# Patient Record
Sex: Male | Born: 1970
Health system: Southern US, Community
[De-identification: ages and names within clinical notes are randomized; demographics above are authoritative.]

## PROBLEM LIST (undated history)

## (undated) DIAGNOSIS — E785 Hyperlipidemia, unspecified: Secondary | ICD-10-CM

## (undated) DIAGNOSIS — T7840XA Allergy, unspecified, initial encounter: Secondary | ICD-10-CM

## (undated) DIAGNOSIS — I2510 Atherosclerotic heart disease of native coronary artery without angina pectoris: Secondary | ICD-10-CM

## (undated) DIAGNOSIS — K635 Polyp of colon: Secondary | ICD-10-CM

## (undated) DIAGNOSIS — E039 Hypothyroidism, unspecified: Secondary | ICD-10-CM

## (undated) DIAGNOSIS — F419 Anxiety disorder, unspecified: Secondary | ICD-10-CM

## (undated) DIAGNOSIS — K219 Gastro-esophageal reflux disease without esophagitis: Secondary | ICD-10-CM

## (undated) DIAGNOSIS — Z800 Family history of malignant neoplasm of digestive organs: Secondary | ICD-10-CM

## (undated) DIAGNOSIS — F329 Major depressive disorder, single episode, unspecified: Secondary | ICD-10-CM

## (undated) DIAGNOSIS — C4491 Basal cell carcinoma of skin, unspecified: Secondary | ICD-10-CM

## (undated) DIAGNOSIS — F32A Depression, unspecified: Secondary | ICD-10-CM

## (undated) DIAGNOSIS — C8190 Hodgkin lymphoma, unspecified, unspecified site: Secondary | ICD-10-CM

## (undated) DIAGNOSIS — E663 Overweight: Secondary | ICD-10-CM

## (undated) DIAGNOSIS — L570 Actinic keratosis: Secondary | ICD-10-CM

## (undated) HISTORY — DX: Polyp of colon: K63.5

## (undated) HISTORY — DX: Actinic keratosis: L57.0

## (undated) HISTORY — DX: Allergy, unspecified, initial encounter: T78.40XA

## (undated) HISTORY — DX: Hyperlipidemia, unspecified: E78.5

## (undated) HISTORY — DX: Family history of malignant neoplasm of digestive organs: Z80.0

## (undated) HISTORY — DX: Atherosclerotic heart disease of native coronary artery without angina pectoris: I25.10

## (undated) HISTORY — DX: Gastro-esophageal reflux disease without esophagitis: K21.9

## (undated) HISTORY — DX: Depression, unspecified: F32.A

## (undated) HISTORY — DX: Overweight: E66.3

## (undated) HISTORY — DX: Anxiety disorder, unspecified: F41.9

## (undated) HISTORY — DX: Hypothyroidism, unspecified: E03.9

---
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
## 2004-10-21 ENCOUNTER — Ambulatory Visit: Payer: Self-pay | Admitting: Internal Medicine

## 2004-10-28 ENCOUNTER — Ambulatory Visit: Payer: Self-pay | Admitting: Surgery

## 2004-12-17 ENCOUNTER — Ambulatory Visit: Payer: Self-pay | Admitting: Gastroenterology

## 2006-11-15 IMAGING — CT CT ABD-PELV W/ CM
1 of 2 series · 15 of 32 positions shown, 19 images · non-contrast
Comparison: none

REASON FOR EXAM: Right upper quadrant pain. Evaluate appendicitis; call
COMMENTS:

[Series 2: appendicitis · axial · 0.70mm/px · z∈[-878,-450]mm · 15 of 160 slices shown, 19 images]
[im 11/160  soft-tissue]
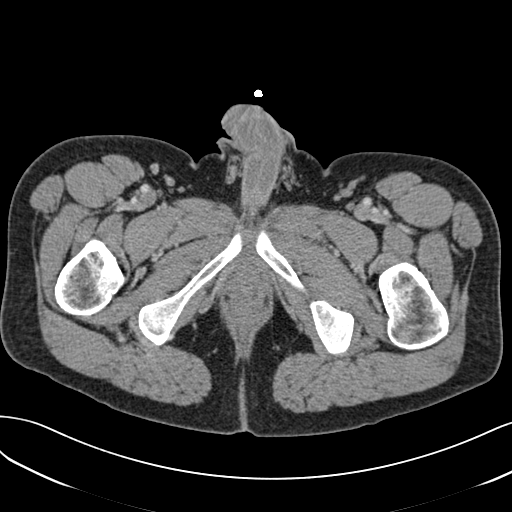
[im 11/160  bone]
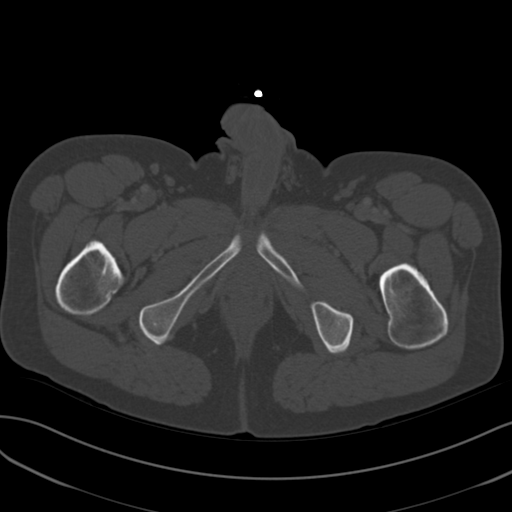
[im 22/160  soft-tissue]
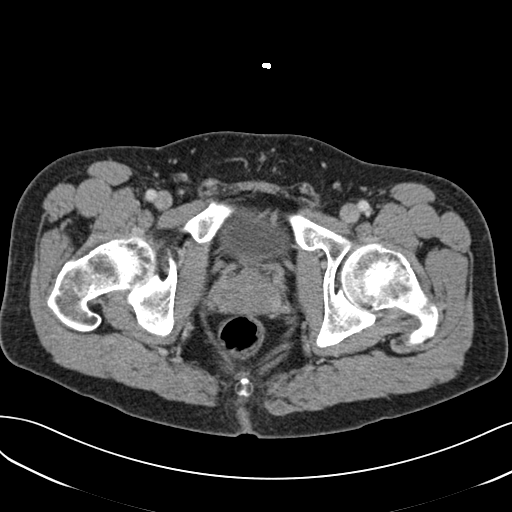
[im 33/160  soft-tissue]
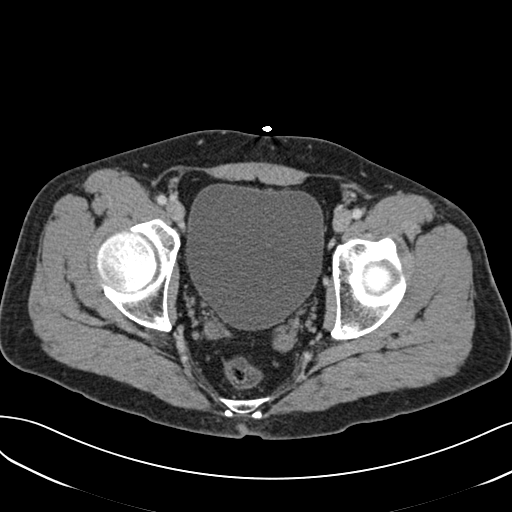
[im 44/160  soft-tissue]
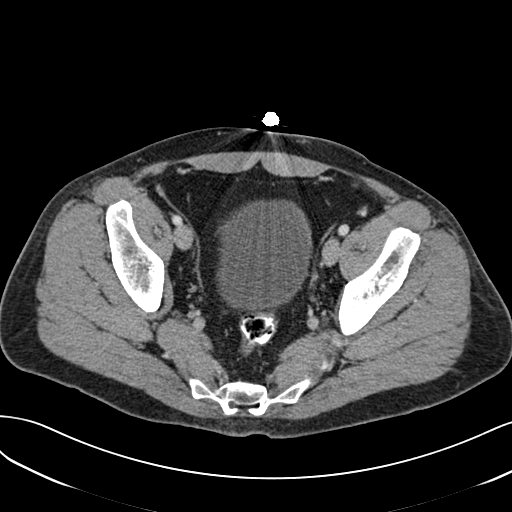
[im 55/160  soft-tissue]
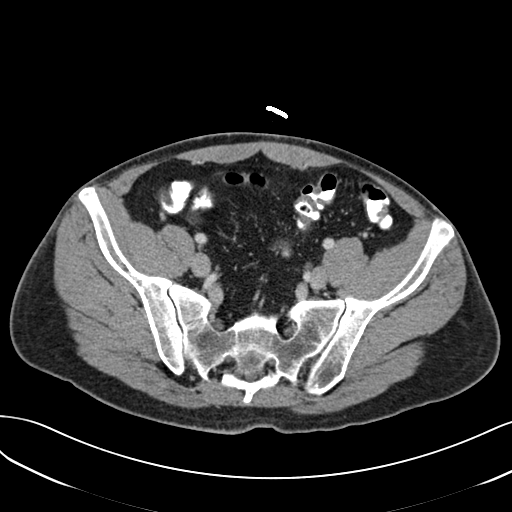
[im 66/160  soft-tissue]
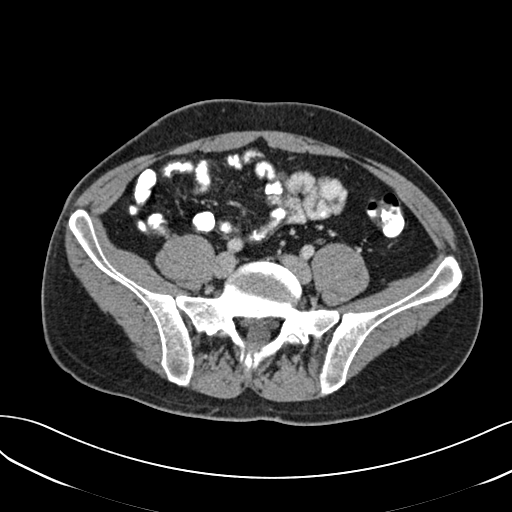
[im 83/160  soft-tissue]
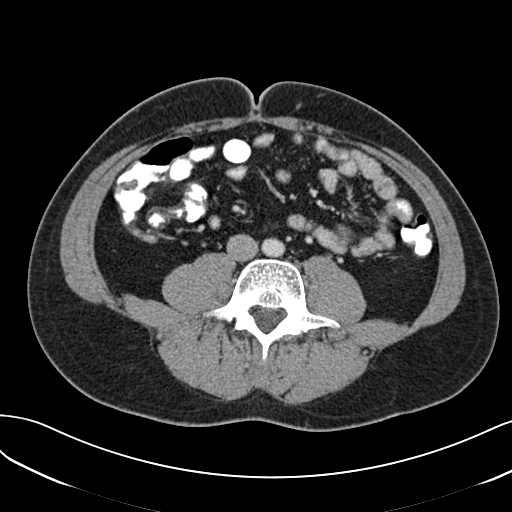
[im 94/160  soft-tissue]
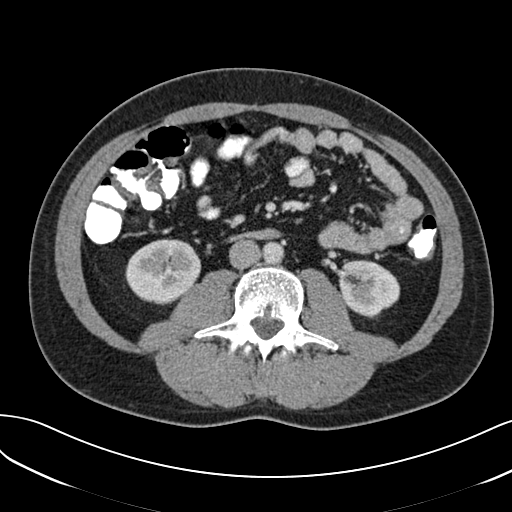
[im 105/160  soft-tissue]
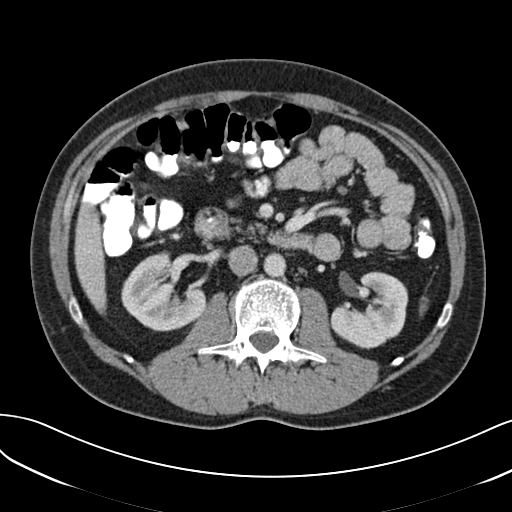
[im 105/160  bone]
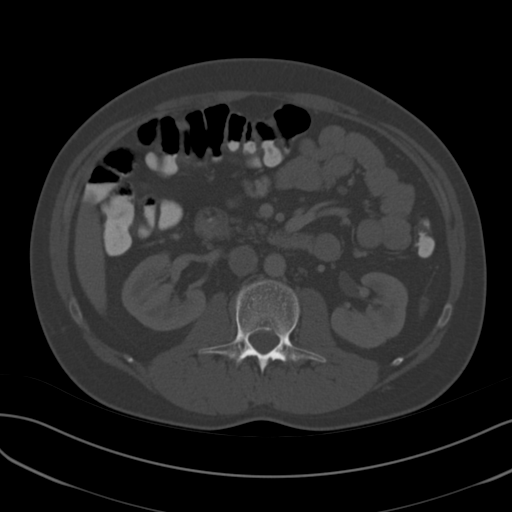
[im 116/160  soft-tissue]
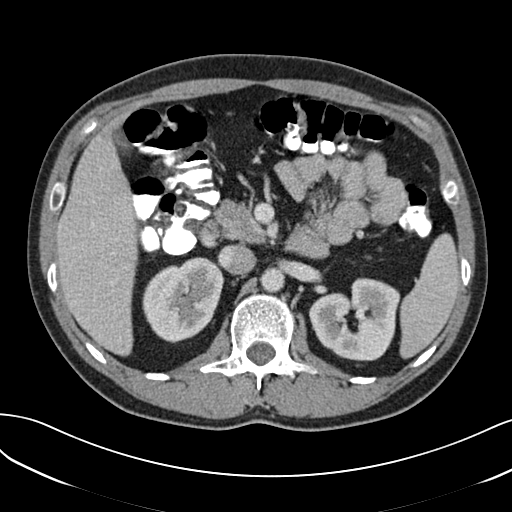
[im 127/160  soft-tissue]
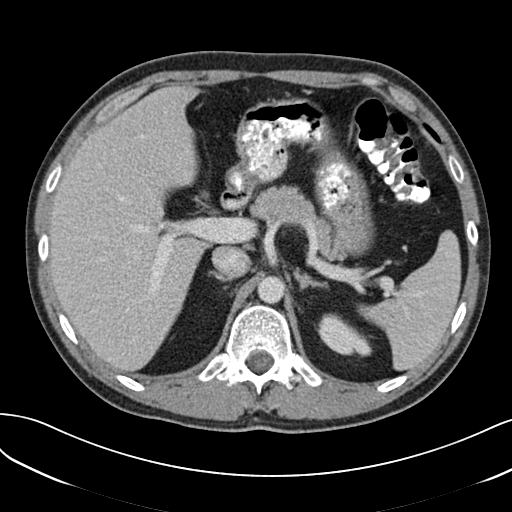
[im 138/160  soft-tissue]
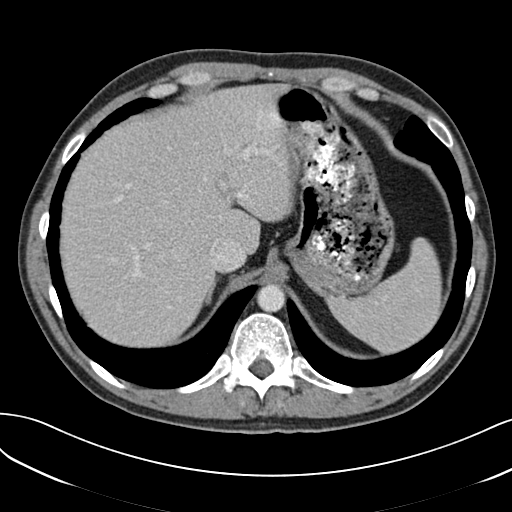
[im 138/160  lung]
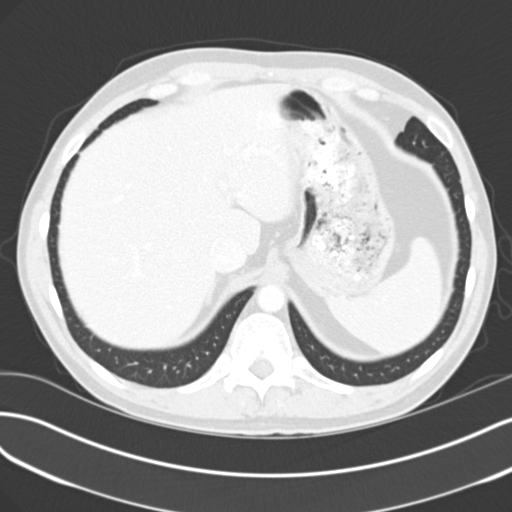
[im 143/160  lung]
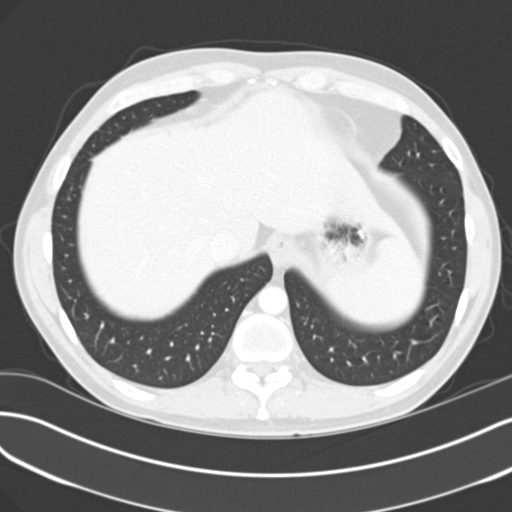
[im 149/160  soft-tissue]
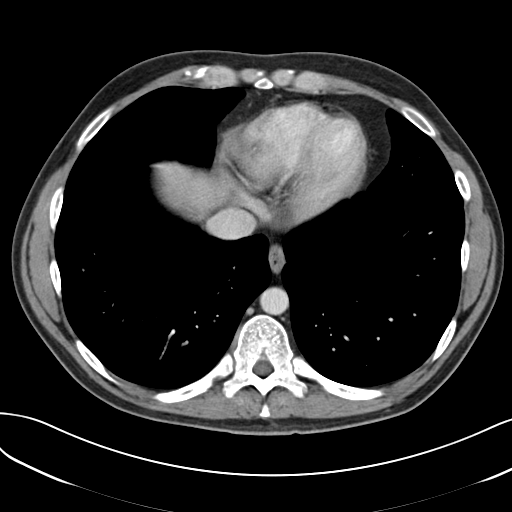
[im 149/160  lung]
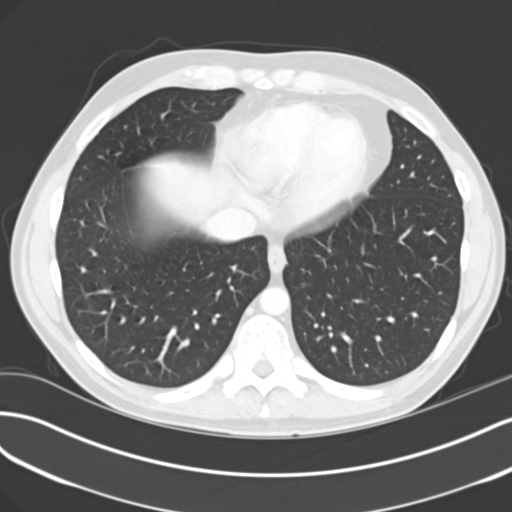
[im 154/160  lung]
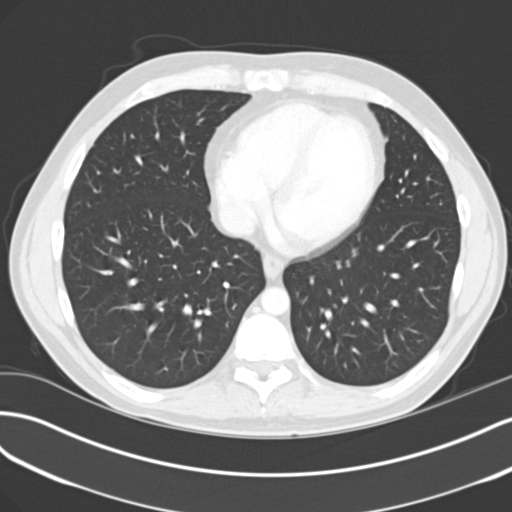

[15 of 32 positions shown; findings below may reference images not displayed]

PROCEDURE:     CT  - CT ABDOMEN / PELVIS  W  - October 21, 2004  [DATE]

RESULT:      IV and oral-enhanced CT scan of the abdomen and pelvis is
performed.  The patient has no prior stud for comparison.  The appendix is
seen and does not appear to be abnormally distended.  The appendix is
somewhat long and is seen in a retrocecal or retrocolonic location.  There
is some density within the appendix which could either be secondary to oral
contrast within a small portion of the appendix or a small appendicolith.
This is seen on the axial reconstructions on image #77.  On image 75, there
is some density lateral to the RIGHT colon with somewhat poorly defined
margins.  This may be volume averaging with the ascending colon.  No
significant inflammatory changes are evident.  There is no abscess.  There
is no free fluid or abnormal bowel distention. There is no evidence of
inguinal, pelvic, mesenteric or retroperitoneal adenopathy.  There is no
evidence of diverticulitis.   The aorta is normal in caliber.  The kidneys,
pancreas, adrenal glands, liver and spleen appear to be within normal
limits.  The lung bases are clear.
IMPRESSION: No CT evidence of appendicitis.  There may be a small area of inflammation
of an epiploic appendage which can mimic appendicitis. Continued close
clinical follow up is recommended given the possibility a small
appendicolith cannot be excluded.

## 2006-12-15 ENCOUNTER — Emergency Department: Payer: Self-pay | Admitting: Emergency Medicine

## 2009-06-04 DIAGNOSIS — C4491 Basal cell carcinoma of skin, unspecified: Secondary | ICD-10-CM

## 2009-06-04 DIAGNOSIS — D239 Other benign neoplasm of skin, unspecified: Secondary | ICD-10-CM

## 2009-06-04 HISTORY — DX: Basal cell carcinoma of skin, unspecified: C44.91

## 2009-06-04 HISTORY — DX: Other benign neoplasm of skin, unspecified: D23.9

## 2009-12-03 ENCOUNTER — Ambulatory Visit: Payer: Self-pay | Admitting: General Practice

## 2011-06-04 ENCOUNTER — Ambulatory Visit: Payer: Self-pay | Admitting: General Practice

## 2011-06-05 ENCOUNTER — Ambulatory Visit: Payer: Self-pay | Admitting: General Practice

## 2014-04-16 ENCOUNTER — Ambulatory Visit
Admit: 2014-04-16 | Disposition: A | Discharge status: Home / Self Care | Payer: Self-pay | Attending: General Practice | Admitting: General Practice

## 2014-06-05 HISTORY — PX: COLONOSCOPY: SHX174

## 2014-06-05 HISTORY — PX: ESOPHAGOGASTRODUODENOSCOPY: SHX1529

## 2014-06-05 LAB — HM COLONOSCOPY

## 2015-06-07 ENCOUNTER — Emergency Department
Admission: EM | Admit: 2015-06-07 | Discharge: 2015-06-07 | Disposition: A | Discharge status: Home / Self Care | Payer: No Typology Code available for payment source | Attending: Emergency Medicine | Admitting: Emergency Medicine

## 2015-06-07 ENCOUNTER — Encounter: Payer: Self-pay | Admitting: Emergency Medicine

## 2015-06-07 ENCOUNTER — Emergency Department: Payer: No Typology Code available for payment source

## 2015-06-07 DIAGNOSIS — R109 Unspecified abdominal pain: Secondary | ICD-10-CM | POA: Diagnosis not present

## 2015-06-07 DIAGNOSIS — Y9389 Activity, other specified: Secondary | ICD-10-CM | POA: Diagnosis not present

## 2015-06-07 DIAGNOSIS — Y999 Unspecified external cause status: Secondary | ICD-10-CM | POA: Diagnosis not present

## 2015-06-07 DIAGNOSIS — Y92410 Unspecified street and highway as the place of occurrence of the external cause: Secondary | ICD-10-CM | POA: Diagnosis not present

## 2015-06-07 DIAGNOSIS — Z8571 Personal history of Hodgkin lymphoma: Secondary | ICD-10-CM | POA: Diagnosis not present

## 2015-06-07 DIAGNOSIS — S0083XA Contusion of other part of head, initial encounter: Secondary | ICD-10-CM | POA: Diagnosis not present

## 2015-06-07 DIAGNOSIS — S0990XA Unspecified injury of head, initial encounter: Secondary | ICD-10-CM | POA: Diagnosis present

## 2015-06-07 HISTORY — DX: Hodgkin lymphoma, unspecified, unspecified site: C81.90

## 2015-06-07 MED ORDER — METHOCARBAMOL 750 MG PO TABS: 750.0000 mg | ORAL_TABLET | Freq: Four times a day (QID) | ORAL | Status: DC

## 2015-06-07 MED ORDER — TRAMADOL HCL 50 MG PO TABS: 50.0000 mg | ORAL_TABLET | Freq: Four times a day (QID) | ORAL | Status: DC | PRN

## 2015-06-07 MED ORDER — IBUPROFEN 600 MG PO TABS: 600.0000 mg | ORAL_TABLET | Freq: Three times a day (TID) | ORAL | Status: DC | PRN

## 2015-06-07 NOTE — ED Notes (Signed)
Reviewed d/c instructions, prescriptions, and follow-up care with pt. Pt verbalized understanding
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
## 2015-06-07 NOTE — Discharge Instructions (Signed)
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
## 2015-08-07 DIAGNOSIS — R0602 Shortness of breath: Secondary | ICD-10-CM | POA: Insufficient documentation

## 2015-08-07 DIAGNOSIS — I10 Essential (primary) hypertension: Secondary | ICD-10-CM | POA: Insufficient documentation

## 2015-08-07 DIAGNOSIS — E782 Mixed hyperlipidemia: Secondary | ICD-10-CM | POA: Insufficient documentation

## 2015-11-21 ENCOUNTER — Ambulatory Visit
Admission: RE | Admit: 2015-11-21 | Discharge: 2015-11-21 | Disposition: A | Discharge status: Home / Self Care | Payer: BLUE CROSS/BLUE SHIELD | Source: Ambulatory Visit | Attending: Family Medicine | Admitting: Family Medicine

## 2015-11-21 ENCOUNTER — Other Ambulatory Visit: Payer: Self-pay | Admitting: Family Medicine

## 2015-11-21 DIAGNOSIS — R0600 Dyspnea, unspecified: Secondary | ICD-10-CM

## 2016-04-08 LAB — PSA: PSA: 1.4

## 2016-04-08 LAB — LIPID PANEL
Cholesterol: 170 (ref 0–200)
Cholesterol: 170 (ref 0–200)
HDL: 34 — AB (ref 35–70)
HDL: 34 — AB (ref 35–70)
LDL Cholesterol: 108
LDL Cholesterol: 108
Triglycerides: 141 (ref 40–160)
Triglycerides: 141 (ref 40–160)

## 2016-07-13 DIAGNOSIS — Z9189 Other specified personal risk factors, not elsewhere classified: Secondary | ICD-10-CM | POA: Insufficient documentation

## 2016-07-14 DIAGNOSIS — Z8571 Personal history of Hodgkin lymphoma: Secondary | ICD-10-CM | POA: Insufficient documentation

## 2016-08-05 DIAGNOSIS — I2510 Atherosclerotic heart disease of native coronary artery without angina pectoris: Secondary | ICD-10-CM | POA: Insufficient documentation

## 2016-12-02 DIAGNOSIS — G4719 Other hypersomnia: Secondary | ICD-10-CM | POA: Insufficient documentation

## 2017-04-12 LAB — LIPID PANEL
Cholesterol: 116 (ref 0–200)
HDL: 33 — AB (ref 35–70)
LDL Cholesterol: 61
Triglycerides: 111 (ref 40–160)

## 2017-04-12 LAB — PSA: PSA: 1.2

## 2017-10-20 LAB — TSH: TSH: 2.49 (ref 0.41–5.90)

## 2017-12-15 IMAGING — CR DG CHEST 2V
1 series · 2 of 2 positions shown · non-contrast
Comparison: None.

CLINICAL DATA: Dyspnea.

EXAM:
CHEST  2 VIEW

[Series 1: dg chest 2 view · 0.14mm/px · 2 of 2 slices shown]
[im 1/2]
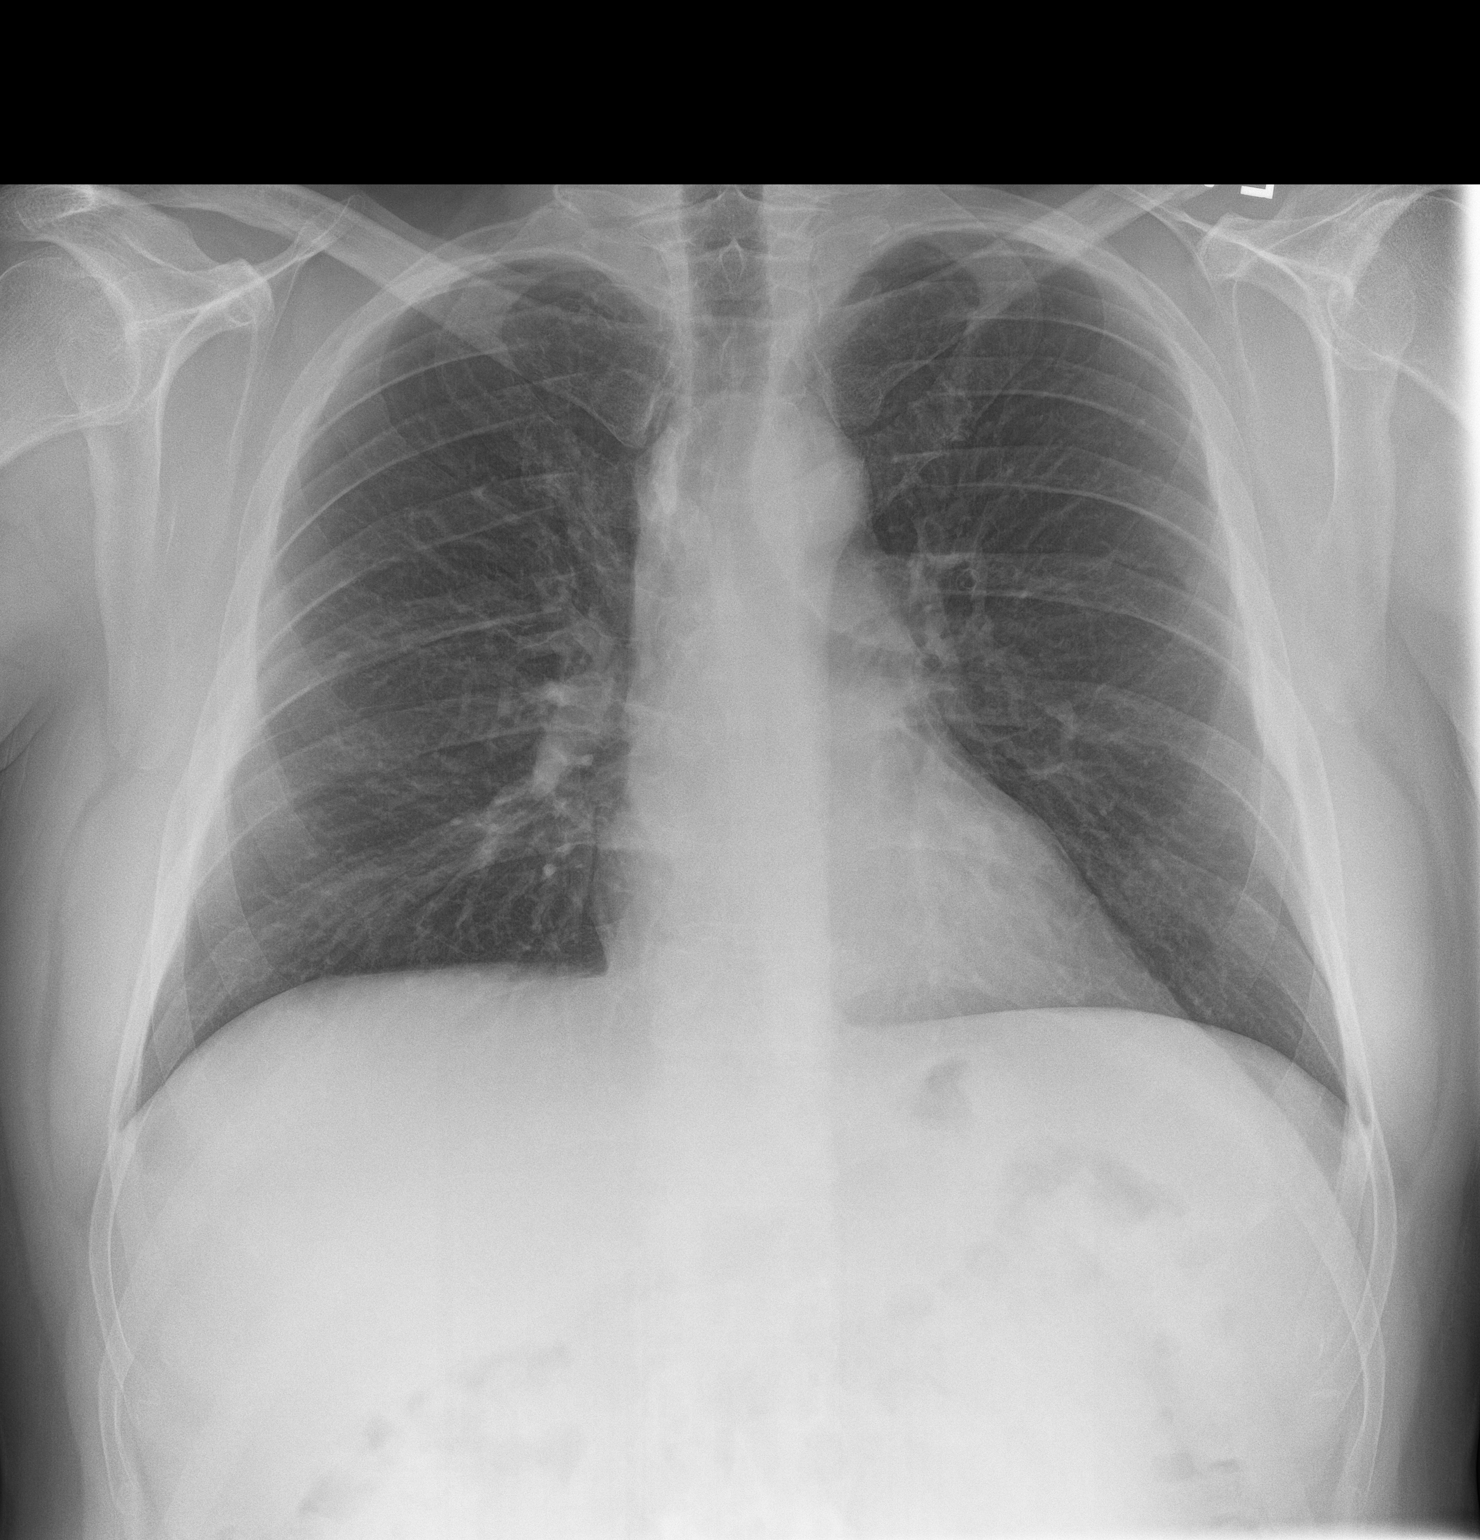
[im 2/2]
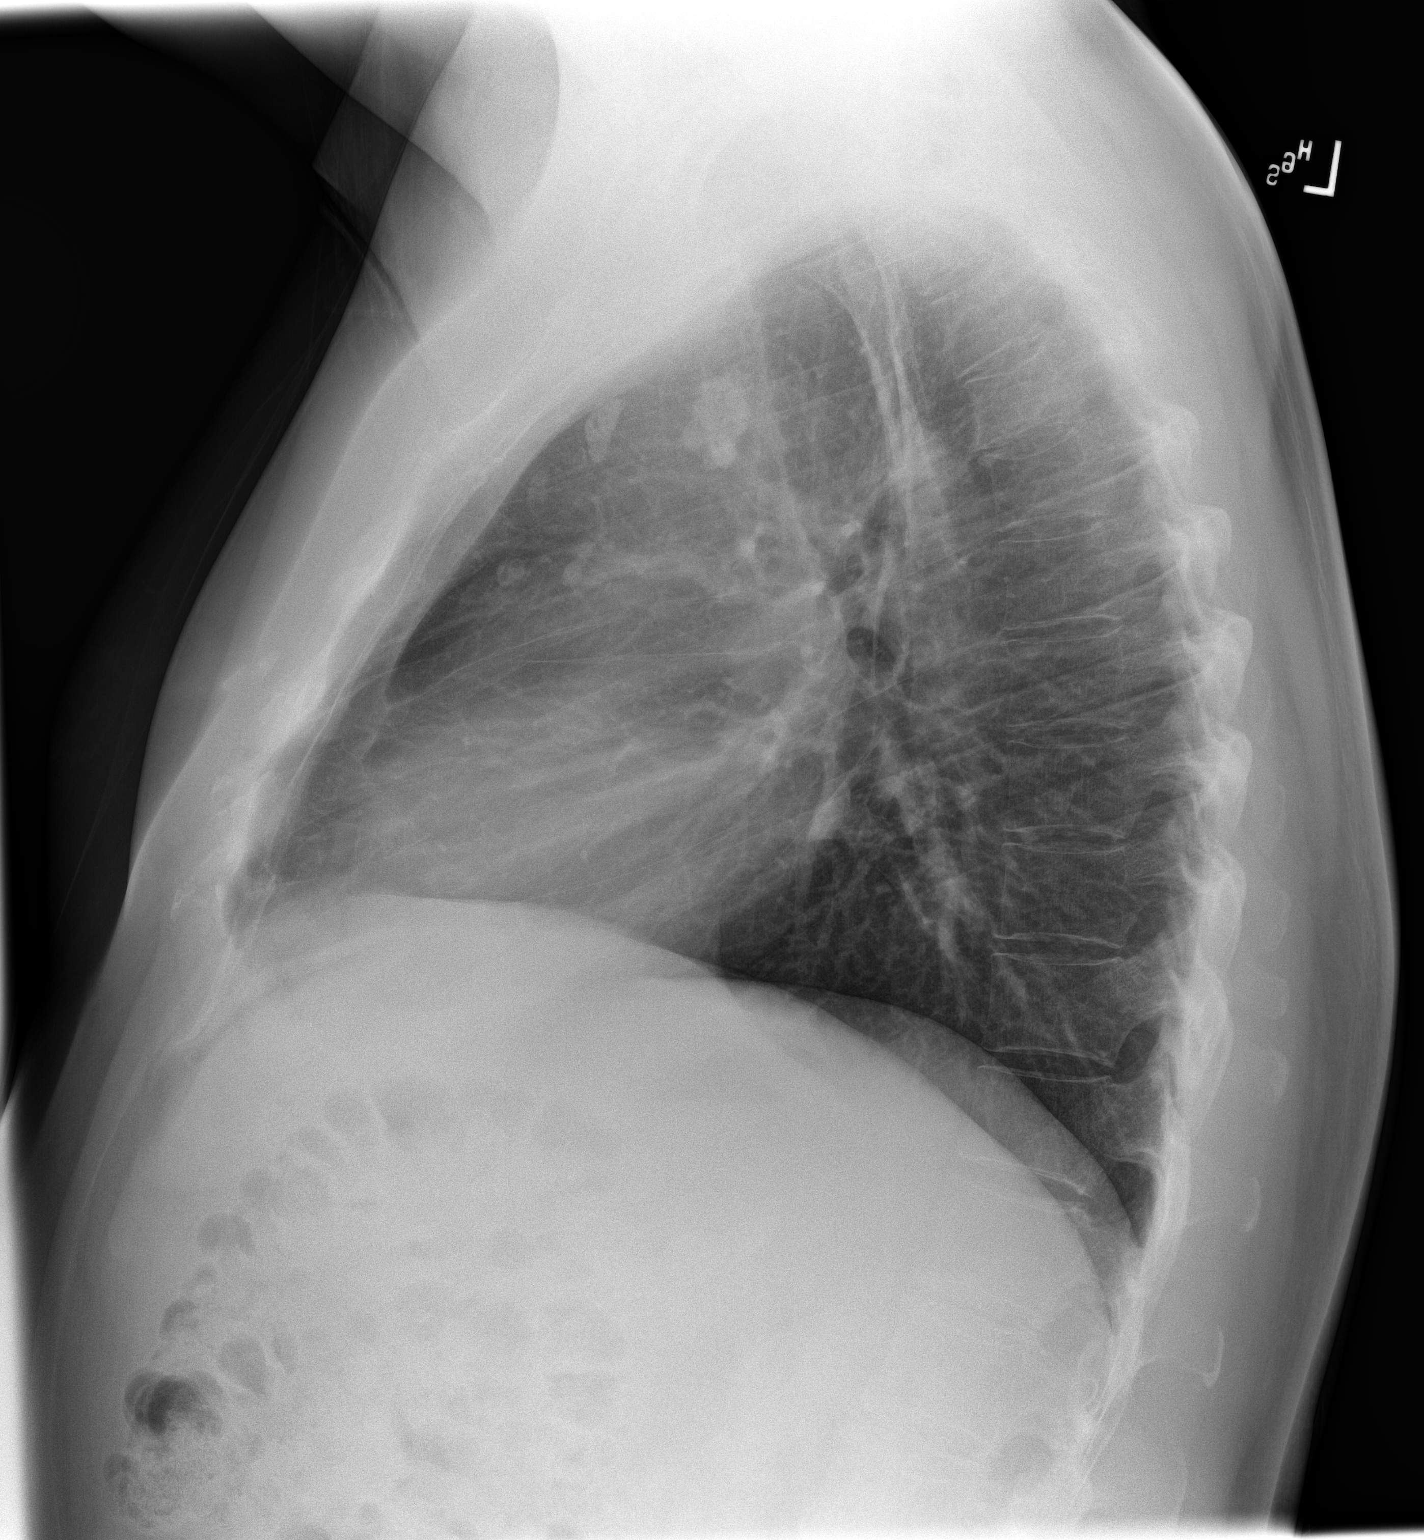

[2 of 2 positions shown; findings below may reference images not displayed]

FINDINGS: The heart size and mediastinal contours are within normal limits. No
pneumothorax or pleural effusion is noted. Probable old calcified
lymph node is noted in right peritracheal region. Both lungs are
clear. The visualized skeletal structures are unremarkable.
IMPRESSION: No active cardiopulmonary disease.

## 2018-01-09 DIAGNOSIS — E039 Hypothyroidism, unspecified: Secondary | ICD-10-CM | POA: Insufficient documentation

## 2018-02-28 ENCOUNTER — Encounter: Payer: BLUE CROSS/BLUE SHIELD | Attending: Internal Medicine | Admitting: Dietician

## 2018-02-28 VITALS — Ht 72.0 in | Wt 238.0 lb

## 2018-02-28 DIAGNOSIS — Z6831 Body mass index (BMI) 31.0-31.9, adult: Secondary | ICD-10-CM

## 2018-02-28 DIAGNOSIS — E785 Hyperlipidemia, unspecified: Secondary | ICD-10-CM

## 2018-02-28 DIAGNOSIS — E6609 Other obesity due to excess calories: Secondary | ICD-10-CM | POA: Diagnosis not present

## 2018-02-28 NOTE — Patient Instructions (Addendum)
Recommendations for Heart Health: Eat more meals at home. Strive for 5 meals prepared at home. Keep frozen vegetables, fruits as sides.  Take lunch to work 3 days per week. Add fruit, raw vegetables as sides. Read labels for sodium- Goal is less than 2000 mg per day Try Ms. DASH seasonings: lemon pepper, garlic pepper, fiesta lime as examples Starkist-no salt added tuna, no salt added tomato sauce, tomatoes to make a sauce (oregano, basil,) Saturated fat goal: less than 14 gms per day. (Refer to handout) May want to try an app like MyFitness Pal to record food/beverage intake.

## 2018-02-28 NOTE — Progress Notes (Signed)
Medical Nutrition Therapy: Visit start time: 11:30 am end time: 1240}  Assessment:  Diagnosis: obesity, hyperlipidemia  Past medical history: hypertension, depression Psychosocial issues/ stress concerns:  Patient scored mild depression on PHQ scale; takes medication and meets with a counselor  Preferred learning method:  . Hands-on  Current weight: 238 lbs (with shoes) Height: 72 in  Medications, supplements: see list  Progress and evaluation:  Patient in for initial medical nutrition therapy appointment. He reports he is making an effort to eat more meals at home. Presently has decreased from 15 meals per week eaten "out" to 12 meals "out" or take out"  His goal weight is 200 lbs which he weighed 5-6 years ago. He is making an effort to choose healthier foods when "out". Previously his lunch choices have been hot dogs,burgers, [place] or [place]. Last week he took some luncheon meat to work and made a sandwich. His dinner meals are usually something he can pick up on the way home from work such as [employer] or [employer] a. For the past few months,he states that since his wife doesn't cook, he has been making an effort to prepare some simple dinner meals such as pork chops with steamed vegetables or meatloaf made with ground beef and a seasoning packet, mashed potatoes and green beans as  examples. He typically drinks sweet tea or soda with meals "out". He also drinks water with sugar free flavor packets added. His present diet is high in sodium. Portions, calories and fat are also more difficult to control with frequent dining out or take out meals. He has an inadequate intake of fruits, vegetables, whole grains and fiber. He has an allergy to the protein in milk and uses almond milk in cereal, etc. When asked about his readiness to make diet changes, he states "I have too". He states he wants a meal plan for heart health.   Physical activity: inconsistent with any type of structured  exercise  Dietary Intake:   Nutrition Care Education:  Weight control:  Discussed challenge of changing eating habits especially of preparing more meals at home and commended on his effort to do so. Instructed on a meal plan based on 1900-2000 calories including how to better balance carbohydrate foods with protein and non-starchy vegetables. Gave and reviewed simple meal ideas.  Also, discussed healthier options when eating "out" or picking up "take out". Heart Health: Instructed on dietary guidelines for heart health including guidelines for sodium and saturated fat and stressed importance of increasing fruits/vegetables in the diet. Discussed alternative seasonings to salt and specific recipes. Discussed importance of structured exercise for both weight control and heart health. Instructed on grapefruit/statin interaction.  Nutritional Diagnosis:  NI-5.10.2 Excessive mineral intake (specify): sodium As related to frequent dining out with high sodium choices.  As evidenced by diet history. NI-5.11.1 Predicted suboptimal nutrient intake As related to low intake of fruits, vegetables, whole grains, fiber.  As evidenced by diet history..  Intervention:  Recommendations for Heart Health: Eat more meals at home. Strive for 5 meals prepared at home. Keep frozen vegetables, fruits as sides.  Take lunch to work 3 days per week. Add fruit, raw vegetables as sides. Read labels for sodium- Goal is less than 2000 mg per day Try Ms. DASH seasonings: lemon pepper, garlic pepper, fiesta lime as examples Starkist-no salt added tuna, no salt added tomato sauce, tomatoes to make a sauce (oregano, basil,) Saturated fat goal: less than 14 gms per day. (Refer to handout) May  want to try an app like MyFitness Pal to record food/beverage intake.   Education Materials given:  . Cholesterol-lowering/ Heart health . Food lists/ Planning A Balanced Meal . Recipes . Quick and Healthy Meals . Fats: The  Good, the Bad and the Ugly . List of high sodium foods and lower sodium alternatives. . Goals/ instructions  Learner/ who was taught:  . Patient   Level of understanding: . Partial understanding; needs review/ practice  Demonstrated degree of understanding via:   Teach back Learning barriers: . None  Willingness to learn/ readiness for change: . Acceptance, ready for change  Monitoring and Evaluation:  Dietary intake, exercise,  and body weight      follow up: March 24 at 10:30am

## 2018-03-28 ENCOUNTER — Ambulatory Visit: Payer: BLUE CROSS/BLUE SHIELD | Admitting: Dietician

## 2018-04-04 ENCOUNTER — Other Ambulatory Visit: Payer: Self-pay

## 2018-04-04 ENCOUNTER — Encounter: Payer: Self-pay | Admitting: Dietician

## 2018-04-04 ENCOUNTER — Encounter: Payer: BLUE CROSS/BLUE SHIELD | Attending: Internal Medicine | Admitting: Dietician

## 2018-04-04 VITALS — Ht 72.0 in | Wt 235.6 lb

## 2018-04-04 DIAGNOSIS — Z6831 Body mass index (BMI) 31.0-31.9, adult: Secondary | ICD-10-CM

## 2018-04-04 DIAGNOSIS — E6609 Other obesity due to excess calories: Secondary | ICD-10-CM

## 2018-04-04 DIAGNOSIS — E785 Hyperlipidemia, unspecified: Secondary | ICD-10-CM

## 2018-04-04 NOTE — Progress Notes (Signed)
Medical Nutrition Therapy Follow-up visit:  Time with patient: 9:00am-9:45am Visit # :2 ASSESSMENT:  Diagnosis: obesity, hyperlipidemia  Current weight:235.6 lbs    Height:72 in BMI: 31.9 Medications: See list Medical History: hypertension, depression Progress and evaluation: Patient in for medical nutrition therapy follow-up. He reports family is eating more meals prepared at home, partly because of change in work schedule due to Covid-19. He goes into work 2-3 days per week and works from home other days. He is eating 3 meals/day. When at home, most meals are prepared at home. On work days, he gets off work at [employer] and picks up a meal. He reports he is eating less at meals. He eats something sweet in the evening but buys portioned single serving cakes, etc as a strategy. He has decreased soda to 2-3 per week. He drinks 4 bottles of water daily with sugar free flavor pkt added. Weight today is 2.4 lbs less than weight 5 weeks ago.  Physical activity:Is walking 4 days/week; 20 minutes  NUTRITION CARE EDUCATION:    Weight control: Reviewed progress with previous goals set. Commended on progress made toward goals. Discussed 2-4 lb loss per month as appropriate rate of loss.  Discussed how increasing intensity of exercise is option if not able to increase frequency.  Reviewed recommendations for fruit/vegetable intake and discussed further easy ways to include. Reviewed balance of foods at meals based on food guide plate. Label Reading: Encouraged again to read labels for sodium and saturated fat as related to heart health and reviewed dietary guidelines for both.  INTERVENTION:  Continue to balance meals with protein, 2-4 servings of carbohydrate (starch, fruit, small serving of sweets) and non-starchy vegetables. Continue to try to increase fruit and vegetable intake. Look at labels for sodium- Goal- no more than 2000 mg/day.  Exercise - 4 days per week for 20-30  minutes  EDUCATION MATERIALS GIVEN:  . Goals/ instructions  LEARNER/ who was taught:  . Patient   LEVEL OF UNDERSTANDING: . Verbalizes/ demonstrates competency Demonstrated degree of understanding via teach back.  LEARNING BARRIERS: . None  WILLINGNESS TO LEARN/READINESS FOR CHANGE: . Change in progress  MONITORING AND EVALUATION:  No follow-up scheduled. Patient was encouraged to call if desires further help with his diet/nutrition.

## 2018-04-04 NOTE — Patient Instructions (Addendum)
Continue to balance meals with protein, 2-4 servings of carbohydrate (starch, fruit, small serving of sweets) and non-starchy vegetables. Continue to try to increase fruit and vegetable intake. Look at labels for sodium- Goal- no more than 2000 mg/day. Exercise - 4 days per week for 20-30 minutes

## 2018-07-10 LAB — TSH
Free Thyroxine Index: 2.2
Free Thyroxine Index: 2.7
T3 Uptake: 24
T3 Uptake: 25
T4, Total: 10.9
T4, Total: 9.2
TSH: 1.72
TSH: 2.13
TSH: 2.68
TSH: 5.39

## 2018-07-10 LAB — HEPATITIS B SURFACE ANTIBODY,QUALITATIVE: Hep B S Ab: REACTIVE

## 2018-07-11 ENCOUNTER — Other Ambulatory Visit: Payer: Self-pay

## 2018-07-11 ENCOUNTER — Encounter: Payer: Self-pay | Admitting: Internal Medicine

## 2018-07-11 ENCOUNTER — Ambulatory Visit: Payer: 59 | Admitting: Internal Medicine

## 2018-07-11 VITALS — BP 116/73 | HR 78 | Temp 96.7°F | Resp 14 | Ht 72.0 in | Wt 234.0 lb

## 2018-07-11 DIAGNOSIS — B359 Dermatophytosis, unspecified: Secondary | ICD-10-CM

## 2018-07-11 DIAGNOSIS — L309 Dermatitis, unspecified: Secondary | ICD-10-CM

## 2018-07-11 DIAGNOSIS — L308 Other specified dermatitis: Secondary | ICD-10-CM

## 2018-07-11 MED ORDER — CLOTRIMAZOLE-BETAMETHASONE 1-0.05 % EX CREA: 1.0000 "application " | TOPICAL_CREAM | Freq: Two times a day (BID) | CUTANEOUS | 1 refills | Status: DC
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
## 2018-09-07 ENCOUNTER — Other Ambulatory Visit: Payer: Self-pay

## 2018-09-07 ENCOUNTER — Encounter: Payer: Self-pay | Admitting: Internal Medicine

## 2018-09-07 ENCOUNTER — Ambulatory Visit: Payer: Self-pay | Admitting: Internal Medicine

## 2018-09-07 VITALS — BP 133/88 | HR 83 | Temp 97.9°F | Resp 16 | Ht 72.0 in | Wt 260.0 lb

## 2018-09-07 DIAGNOSIS — M7050 Other bursitis of knee, unspecified knee: Secondary | ICD-10-CM

## 2018-09-07 DIAGNOSIS — M25861 Other specified joint disorders, right knee: Secondary | ICD-10-CM

## 2018-09-07 NOTE — Progress Notes (Signed)
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 Cheese Anaphylaxis  . Erythromycin Other (See Comments)  . Lac Bovis Swelling     Current Outpatient Medications on File Prior to Visit  Medication Sig Dispense Refill  . aspirin EC 81 MG tablet Take by mouth.    Marland Kitchen atorvastatin (LIPITOR) 80 MG tablet Take by mouth.    . famotidine (PEPCID) 20 MG tablet Take 20 mg by mouth daily.    Marland Kitchen FLUoxetine (PROZAC) 20 MG capsule Take by mouth.    . levothyroxine (SYNTHROID) 100 MCG tablet     . metoprolol succinate (TOPROL-XL) 25 MG 24 hr tablet Take by mouth.     No current facility-administered medications on file prior to visit.     No tob hx  O - NAD, masked, obese  BP 133/88 (BP Location: Right Arm, Patient Position: Sitting, Cuff Size: Large)   Pulse 83   Temp 97.9 F (36.6 C) (Oral)   Resp 16   Ht 6' (1.829 m)   Wt 260 lb (117.9 kg) Comment: in full uniform  SpO2 98%   BMI 35.26 kg/m    Looked at uninjured left knee first and no effusion noted  R  Knee - Could get to full ROM, no limitations, no discomfort in the last 10 degrees of flexion. More hesitancy (noted more painful this morning, better now) Mild crepitus on exam today  No marked effusion  No bruising  Non tender with movement of the patella, no gross deformity of patella  Non tender in  suprapatella bursa region, no plica palpated medially, Not marked tenderness palpating over the anserine bursa where he noted feels the pain intermttently  No medial joint line tenderness, no lateral joint line tenderness with palpation  Med and lat collaterals intact on testing with no gap, no pain with testing  Lachman neg   And and post drawer  McMurrays neg  No pain posterior, no obvious Baker's cyst   Affect was not flat, approp with conversation  A/p - R  knee pain, PF component likely predominant source with a "movie-goers" component noted, Knee very stable on exam today, also feel meniscus without a big tear concern (noted often concern for meniscus with any locking history). 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   Can use aleve prn with food, not routinely rec'ed (does ok with aleve)  Reviewed the PFS exercises  to do and begin as improving (wall squats, towel between the knees, and terminal extensions)  F/u if not improving or worsening, may need further imaging, PT or ortho input pending status over this time.   Discussed job presently and agreed that no limitations presently needed.

## 2018-09-07 NOTE — Patient Instructions (Signed)
Patellofemoral Pain Syndrome  Patellofemoral pain syndrome is a condition in which the tissue (cartilage) on the underside of the kneecap (patella) softens or breaks down. This causes pain in the front of the knee. The condition is also called runner's knee or chondromalacia patella. Patellofemoral pain syndrome is most common in young adults who are active in sports. The knee is the largest joint in the body. The patella covers the front of the knee and is attached to muscles above and below the knee. The underside of the patella is covered with a smooth type of cartilage (synovium). The smooth surface helps the patella to glide easily when you move your knee. Patellofemoral pain syndrome causes swelling in the joint linings and bone surfaces in the knee. What are the causes? This condition may be caused by:  Overuse of the knee.  Poor alignment of your knee joints.  Weak leg muscles.  A direct blow to your kneecap. What increases the risk? You are more likely to develop this condition if:  You do a lot of activities that can wear down your kneecap. These include: ? Running. ? Squatting. ? Climbing stairs.  You start a new physical activity or exercise program.  You wear shoes that do not fit well.  You do not have good leg strength.  You are overweight. What are the signs or symptoms? The main symptom of this condition is knee pain. This may feel like a dull, aching pain underneath your patella, in the front of your knee. There may be a popping or cracking sound when you move your knee. Pain may get worse with:  Exercise.  Climbing stairs.  Running.  Jumping.  Squatting.  Kneeling.  Sitting for a long time.  Moving or pushing on your patella. How is this diagnosed? This condition may be diagnosed based on:  Your symptoms and medical history. You may be asked about your recent physical activities and which ones cause knee pain.  A physical exam. This may include:  ? Moving your patella back and forth. ? Checking your range of knee motion. ? Having you squat or jump to see if you have pain. ? Checking the strength of your leg muscles.  Imaging tests to confirm the diagnosis. These may include an MRI of your knee. How is this treated? This condition may be treated at home with rest, ice, compression, and elevation (RICE).  Other treatments may include:  Nonsteroidal anti-inflammatory drugs (NSAIDs).  Physical therapy to stretch and strengthen your leg muscles.  Shoe inserts (orthotics) to take stress off your knee.  A knee brace or knee support.  Adhesive tapes to the skin.  Surgery to remove damaged cartilage or move the patella to a better position. This is rare. Follow these instructions at home: If you have a shoe or brace:  Wear the shoe or brace as told by your health care provider. Remove it only as told by your health care provider.  Loosen the shoe or brace if your toes tingle, become numb, or turn cold and blue.  Keep the shoe or brace clean.  If the shoe or brace is not waterproof: ? Do not let it get wet. ? Cover it with a watertight covering when you take a bath or a shower. Managing pain, stiffness, and swelling  If directed, put ice on the painful area. ? If you have a removable shoe or brace, remove it as told by your health care provider. ? Put ice in a plastic bag. ?   Place a towel between your skin and the bag. ? Leave the ice on for 20 minutes, 2-3 times a day.  Move your toes often to avoid stiffness and to lessen swelling.  Rest your knee: ? Avoid activities that cause knee pain. ? When sitting or lying down, raise (elevate) the injured area above the level of your heart, whenever possible. General instructions  Take over-the-counter and prescription medicines only as told by your health care provider.  Use splints, braces, knee supports, or walking aids as directed by your health care provider.  Perform  stretching and strengthening exercises as told by your health care provider or physical therapist.  Do not use any products that contain nicotine or tobacco, such as cigarettes and e-cigarettes. These can delay healing. If you need help quitting, ask your health care provider.  Return to your normal activities as told by your health care provider. Ask your health care provider what activities are safe for you.  Keep all follow-up visits as told by your health care provider. This is important. Contact a health care provider if:  Your symptoms get worse.  You are not improving with home care. Summary  Patellofemoral pain syndrome is a condition in which the tissue (cartilage) on the underside of the kneecap (patella) softens or breaks down.  This condition causes swelling in the joint linings and bone surfaces in the knee. This leads to pain in the front of the knee.  This condition may be treated at home with rest, ice, compression, and elevation (RICE).  Use splints, braces, knee supports, or walking aids as directed by your health care provider. This information is not intended to replace advice given to you by your health care provider. Make sure you discuss any questions you have with your health care provider. Document Released: 12/09/2008 Document Revised: 01/31/2017 Document Reviewed: 01/31/2017 Elsevier Patient Education  2020 [employer]  

## 2018-10-02 DIAGNOSIS — Z1283 Encounter for screening for malignant neoplasm of skin: Secondary | ICD-10-CM | POA: Diagnosis not present

## 2018-10-02 DIAGNOSIS — L918 Other hypertrophic disorders of the skin: Secondary | ICD-10-CM | POA: Diagnosis not present

## 2018-10-02 DIAGNOSIS — Z85828 Personal history of other malignant neoplasm of skin: Secondary | ICD-10-CM | POA: Diagnosis not present

## 2018-10-02 DIAGNOSIS — L910 Hypertrophic scar: Secondary | ICD-10-CM | POA: Diagnosis not present

## 2018-10-02 DIAGNOSIS — L820 Inflamed seborrheic keratosis: Secondary | ICD-10-CM | POA: Diagnosis not present

## 2018-10-02 DIAGNOSIS — L814 Other melanin hyperpigmentation: Secondary | ICD-10-CM | POA: Diagnosis not present

## 2018-10-02 DIAGNOSIS — L821 Other seborrheic keratosis: Secondary | ICD-10-CM | POA: Diagnosis not present

## 2018-10-02 DIAGNOSIS — D485 Neoplasm of uncertain behavior of skin: Secondary | ICD-10-CM | POA: Diagnosis not present

## 2018-10-06 ENCOUNTER — Ambulatory Visit: Payer: Self-pay

## 2018-10-06 DIAGNOSIS — Z23 Encounter for immunization: Secondary | ICD-10-CM

## 2018-10-11 DIAGNOSIS — H1132 Conjunctival hemorrhage, left eye: Secondary | ICD-10-CM | POA: Diagnosis not present

## 2018-10-30 DIAGNOSIS — M531 Cervicobrachial syndrome: Secondary | ICD-10-CM | POA: Diagnosis not present

## 2018-10-30 DIAGNOSIS — M25512 Pain in left shoulder: Secondary | ICD-10-CM | POA: Diagnosis not present

## 2018-10-30 DIAGNOSIS — M5386 Other specified dorsopathies, lumbar region: Secondary | ICD-10-CM | POA: Diagnosis not present

## 2018-10-30 DIAGNOSIS — M25511 Pain in right shoulder: Secondary | ICD-10-CM | POA: Diagnosis not present

## 2018-10-30 DIAGNOSIS — M9902 Segmental and somatic dysfunction of thoracic region: Secondary | ICD-10-CM | POA: Diagnosis not present

## 2018-11-09 ENCOUNTER — Ambulatory Visit: Payer: Self-pay | Admitting: Physician Assistant

## 2018-11-09 ENCOUNTER — Encounter: Payer: Self-pay | Admitting: Physician Assistant

## 2018-11-09 ENCOUNTER — Other Ambulatory Visit: Payer: Self-pay

## 2018-11-09 VITALS — BP 133/84 | HR 94 | Temp 97.9°F | Resp 14 | Wt 244.6 lb

## 2018-11-09 DIAGNOSIS — M79602 Pain in left arm: Secondary | ICD-10-CM

## 2018-11-09 DIAGNOSIS — Z0000 Encounter for general adult medical examination without abnormal findings: Secondary | ICD-10-CM

## 2018-11-09 NOTE — Progress Notes (Signed)
Subjective:    Patient ID: Samuel Padilla, male    DOB: June 27, 1970, 48 y.o.   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 Over the past few weeks has been aware of waxing and waning discomfort left arm. 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  Denies golf, tennis , recreational , hobby or other occupational changes. Has had tennis elbow in the past and once had left wrist tendonitis.  Left arm soft tissue aching discomfort near elbow reported, denies ROM pain or bone pain .  Is aware of steady weight gain last few years Has GERD and is careful with NSAIDS  With careful review and questioning he realizes he has felt better the last 3-4 days-- interestingly correlates with a few days off  ROS  otherwise negative Note did not take BP meds today. Encourage establish routine consider pill box as reminder prn     Objective:   Physical Exam Vitals signs reviewed.  Constitutional:      General: He is not in acute distress.    Appearance: Normal appearance. He is not ill-appearing.     Comments: overweight  HENT:     Head: Normocephalic and atraumatic.     Right Ear: Tympanic membrane normal.     Left Ear: Tympanic membrane normal.     Mouth/Throat:     Mouth: Mucous membranes are moist.  Eyes:     Extraocular Movements: Extraocular movements intact.  Neck:     Musculoskeletal: Normal range of motion and neck supple. No neck rigidity or muscular tenderness.     Comments: No tenderness with palpation over c-spines- numbness and tingling denied Cardiovascular:     Rate and Rhythm: Normal rate.     Pulses: Normal pulses.  Pulmonary:     Effort: Pulmonary effort is normal.  Musculoskeletal: Normal range of motion.        General: No swelling or signs of injury.     Comments: FROM bilateral arms at the shoulders Left specifically pronate/supinate ,  adduct abduct without hesitation Elbow flexion and extension without pain at this time Posture is slumped with head ,neck shoulders rolled forward No tenderness with palpation of spine  Lymphadenopathy:     Cervical: No cervical adenopathy.  Skin:    General: Skin is warm and dry.     Capillary Refill: Capillary refill takes less than 2 seconds.     Findings: No bruising.  Neurological:     General: No focal deficit present.     Mental Status: He is alert.     Comments: No numbness or tingling of left arm  Psychiatric:        Mood and Affect: Mood normal.       Assessment & Plan:  Left arm discomfort, improving  Positional discomfort suspected, long shift in car with conversation focused left. Seatbelt over opposite shoulder He defers pain Rx at this time. Uses an occasional Aleve- suggested 1 tab acetaminophen with I tab 200mg  ibuprofen trial-TID;  also consider [place] type  heat pads to upper mid back/neck when out of heavy uniform/vest. May use topical capsaicin cream if desired.  Rec he concentrate on posture and body mechanics. He has just started walking program with wife, weight loss encouraged. About to change cars for work shift- will observe if effect on discomfort. Report back to clinic PRN and  with any increased symptoms/concerns

## 2018-11-09 NOTE — Progress Notes (Signed)
Here today for pain in left arm. Rates pain a 3 out of 10.  Pain is around the elbow but not in the joint.

## 2018-11-17 DIAGNOSIS — M9902 Segmental and somatic dysfunction of thoracic region: Secondary | ICD-10-CM | POA: Diagnosis not present

## 2018-11-17 DIAGNOSIS — M7611 Psoas tendinitis, right hip: Secondary | ICD-10-CM | POA: Diagnosis not present

## 2018-11-17 DIAGNOSIS — M5386 Other specified dorsopathies, lumbar region: Secondary | ICD-10-CM | POA: Diagnosis not present

## 2018-11-17 DIAGNOSIS — M25551 Pain in right hip: Secondary | ICD-10-CM | POA: Diagnosis not present

## 2018-11-17 DIAGNOSIS — M531 Cervicobrachial syndrome: Secondary | ICD-10-CM | POA: Diagnosis not present

## 2018-12-27 ENCOUNTER — Other Ambulatory Visit: Payer: Self-pay

## 2018-12-27 ENCOUNTER — Ambulatory Visit: Payer: Self-pay

## 2018-12-27 DIAGNOSIS — Z0000 Encounter for general adult medical examination without abnormal findings: Secondary | ICD-10-CM

## 2018-12-27 LAB — POCT URINALYSIS DIPSTICK
Bilirubin, UA: NEGATIVE
Blood, UA: NEGATIVE
Glucose, UA: NEGATIVE
Ketones, UA: NEGATIVE
Leukocytes, UA: NEGATIVE
Nitrite, UA: NEGATIVE
Protein, UA: NEGATIVE
Spec Grav, UA: 1.015 (ref 1.010–1.025)
Urobilinogen, UA: 0.2 E.U./dL
pH, UA: 6 (ref 5.0–8.0)

## 2018-12-27 NOTE — Progress Notes (Signed)
Scheduled to complete physical 01/04/2019 with Laurey Morale, PA-C (Interim Provider).  AMD

## 2018-12-28 LAB — CMP12+LP+TP+TSH+6AC+PSA+CBC…
ALT: 17 IU/L (ref 0–44)
AST: 19 IU/L (ref 0–40)
Albumin/Globulin Ratio: 1.9 (ref 1.2–2.2)
Albumin: 4.3 g/dL (ref 4.0–5.0)
Alkaline Phosphatase: 99 IU/L (ref 39–117)
BUN/Creatinine Ratio: 9 (ref 9–20)
Basos: 1 %
Bilirubin Total: 0.7 mg/dL (ref 0.0–1.2)
Chloride: 105 mmol/L (ref 96–106)
Chol/HDL Ratio: 4.8 ratio (ref 0.0–5.0)
Cholesterol, Total: 155 mg/dL (ref 100–199)
Creatinine, Ser: 1.12 mg/dL (ref 0.76–1.27)
Estimated CHD Risk: 1 times avg. (ref 0.0–1.0)
Free Thyroxine Index: 2 (ref 1.2–4.9)
Glucose: 93 mg/dL (ref 65–99)
Hemoglobin: 17 g/dL (ref 13.0–17.7)
Immature Granulocytes: 0 %
LDH: 193 IU/L (ref 121–224)
Lymphs: 21 %
MCH: 31.9 pg (ref 26.6–33.0)
MCHC: 33.9 g/dL (ref 31.5–35.7)
MCV: 94 fL (ref 79–97)
Potassium: 4.3 mmol/L (ref 3.5–5.2)
RBC: 5.33 x10E6/uL (ref 4.14–5.80)
RDW: 12.6 % (ref 11.6–15.4)
Sodium: 143 mmol/L (ref 134–144)
TSH: 2.65 u[IU]/mL (ref 0.450–4.500)
Total Protein: 6.6 g/dL (ref 6.0–8.5)
Uric Acid: 6.4 mg/dL (ref 3.8–8.4)

## 2018-12-28 LAB — CMP12+LP+TP+TSH+6AC+PSA+CBC?
BUN: 10 mg/dL (ref 6–24)
Basophils Absolute: 0.1 10*3/uL (ref 0.0–0.2)
Calcium: 8.6 mg/dL — ABNORMAL LOW (ref 8.7–10.2)
EOS (ABSOLUTE): 0.3 10*3/uL (ref 0.0–0.4)
Eos: 3 %
GFR calc Af Amer: 89 mL/min/{1.73_m2} (ref >59)
GFR calc non Af Amer: 77 mL/min/{1.73_m2} (ref >59)
GGT: 27 IU/L (ref 0–65)
Globulin, Total: 2.3 g/dL (ref 1.5–4.5)
HDL: 32 mg/dL — ABNORMAL LOW (ref >39)
Hematocrit: 50.1 % (ref 37.5–51.0)
Immature Grans (Abs): 0 10*3/uL (ref 0.0–0.1)
Iron: 90 ug/dL (ref 38–169)
LDL Chol Calc (NIH): 94 mg/dL (ref 0–99)
Lymphocytes Absolute: 2 10*3/uL (ref 0.7–3.1)
Monocytes Absolute: 0.7 10*3/uL (ref 0.1–0.9)
Monocytes: 7 %
Neutrophils Absolute: 6.5 10*3/uL (ref 1.4–7.0)
Neutrophils: 68 %
Phosphorus: 2.8 mg/dL (ref 2.8–4.1)
Platelets: 236 10*3/uL (ref 150–450)
Prostate Specific Ag, Serum: 1.1 ng/mL (ref 0.0–4.0)
T3 Uptake Ratio: 22 % — ABNORMAL LOW (ref 24–39)
T4, Total: 9.2 ug/dL (ref 4.5–12.0)
Triglycerides: 168 mg/dL — ABNORMAL HIGH (ref 0–149)
VLDL Cholesterol Cal: 29 mg/dL (ref 5–40)
WBC: 9.5 10*3/uL (ref 3.4–10.8)

## 2019-01-04 ENCOUNTER — Ambulatory Visit: Payer: Self-pay | Admitting: Physician Assistant

## 2019-01-04 ENCOUNTER — Other Ambulatory Visit: Payer: Self-pay

## 2019-01-04 ENCOUNTER — Encounter: Payer: Self-pay | Admitting: Physician Assistant

## 2019-01-04 VITALS — BP 112/72 | HR 79 | Temp 98.7°F | Resp 12 | Wt 239.0 lb

## 2019-01-04 DIAGNOSIS — M79602 Pain in left arm: Secondary | ICD-10-CM

## 2019-01-04 DIAGNOSIS — Z0000 Encounter for general adult medical examination without abnormal findings: Secondary | ICD-10-CM

## 2019-01-04 NOTE — Progress Notes (Signed)
Taking the Atorvastatin & Metroprolol succinate.

## 2019-01-04 NOTE — Progress Notes (Signed)
Subjective:    Patient ID: Samuel Padilla, male    DOB: November 14, 1970, 48 y.o.   MRN: [id number]  HPI Annual exam Hx Hodgkin's 1989-llb nodular sclerosing-with neck and mediastinal radiation- followed by Dermatology, Cardio-oncology, Dental every 6 month   Multiple Basal cell cancers from chest and back thought related to radiation therapy Close follow up continues  GI 2016 colonoscopy with 2 polyps - repeat 2019 , not yet accomplished Covid 19 +Fam Hx: Father colon cancer  Cardiovascular- Dr Lora Paula, [facility]-no CP, DOE with routine level walk-some with stair climbing at office,no SOB, no rhythm disturbances noted No LE edema Fam Hx CAD; personal CT angio 2018 moderate LAD   Hx GERD avoiding NSAIDs yields improvement- tolerates 1 -2 Alleve per day Denies recent discomfort.   Left forearm discomfort in early November- thought to be related to position and perhaps some gym workouts. Has improved significantly . No pain if he uses Aleve--but often forgets and then has discomfort. Denies increase with any particular ROM, denies dropping items or change in forearm strength, sensation, temp Didn't use topicals previously suggested- has not tried acetominophen   Review of Systems  All other systems reviewed and are negative.  Dairy protein allergy reported- cheese anaphylaxis    Objective:   Physical Exam Constitutional:      General: He is not in acute distress.    Appearance: Normal appearance.     Comments: 239 at 6 feet tall-- weight loss encouraged 1/2 ppwk  HENT:     Head: Normocephalic and atraumatic.     Right Ear: Tympanic membrane normal.     Left Ear: Tympanic membrane and ear canal normal.     Ears:     Comments: Right canal + cerumen, non obstructing - has kit at home to Rx- Denies discomfort or hearing change Scarring post auricle-right- from excisional biopsy     Nose: Nose normal.     Mouth/Throat:     Mouth: Mucous membranes are moist.     Comments:  Good repair Eyes:     Extraocular Movements: Extraocular movements intact.  Neck:     Comments: Neg for glandular enlargement Cardiovascular:     Rate and Rhythm: Normal rate and regular rhythm.     Pulses: Normal pulses.     Heart sounds: Normal heart sounds. No murmur. No friction rub. No gallop.      Comments: BP 110/72 Pulmonary:     Effort: Pulmonary effort is normal.     Breath sounds: Normal breath sounds.  Abdominal:     General: There is no distension.     Palpations: Abdomen is soft. There is no mass.     Tenderness: There is no abdominal tenderness.  Genitourinary:    Comments: deferred Musculoskeletal:        General: Normal range of motion.     Cervical back: Normal range of motion and neck supple. No tenderness.     Comments: Left brachioradialis mildly tender to palpation , manipulaton. Good ROM, good grip, and cap fill   Lymphadenopathy:     Cervical: No cervical adenopathy.  Skin:    General: Skin is warm and dry.     Capillary Refill: Capillary refill takes less than 2 seconds.  Neurological:     General: No focal deficit present.     Mental Status: He is alert.  Psychiatric:        Mood and Affect: Mood normal.        Behavior: Behavior  normal.     Comments: Has had issues with depression in the past but feels Prozac currently well tolerated/well supported Anxiety            Assessment & Plan:  Try Tylenol for lingering left arm discomfort - topical Rx- try OTCs again encouraged that this exam is steadily improving. he defers offer of Ortho consultation at this time but will report back if persistent  Patient will contact GI to confirm that he is on waiting list for colonoscopy due 2019 Has Cardiology appointment next week- [facility] records- paper copy to hand carry given EKG and labs  Discussed EKG with patient and he is given copy as well as copy of labs. Facing Holiday weekend and already scheduled for F/U with Cardio next week. Report to  [facility] if any episode of discomfort/concern With Cards OK encourage 15 minute level surface walk daily- for HDL boost Healthy food choices and portion control  Continue medications as currently ordered-denies needed Rx at this time RTC 6 months or PRN questions/concerns

## 2019-01-12 DIAGNOSIS — I10 Essential (primary) hypertension: Secondary | ICD-10-CM | POA: Diagnosis not present

## 2019-01-12 DIAGNOSIS — I2510 Atherosclerotic heart disease of native coronary artery without angina pectoris: Secondary | ICD-10-CM | POA: Diagnosis not present

## 2019-01-23 ENCOUNTER — Other Ambulatory Visit: Payer: Self-pay

## 2019-01-24 MED ORDER — LEVOTHYROXINE SODIUM 100 MCG PO TABS: 100.0000 ug | ORAL_TABLET | Freq: Every day | ORAL | 1 refills | Status: DC

## 2019-03-13 ENCOUNTER — Other Ambulatory Visit: Payer: Self-pay

## 2019-03-13 DIAGNOSIS — F419 Anxiety disorder, unspecified: Secondary | ICD-10-CM

## 2019-03-14 MED ORDER — FLUOXETINE HCL 20 MG PO CAPS: 20.0000 mg | ORAL_CAPSULE | Freq: Every day | ORAL | 2 refills | Status: DC

## 2019-03-26 ENCOUNTER — Other Ambulatory Visit: Payer: Self-pay

## 2019-03-26 ENCOUNTER — Ambulatory Visit (INDEPENDENT_AMBULATORY_CARE_PROVIDER_SITE_OTHER): Payer: 59 | Admitting: Dermatology

## 2019-03-26 DIAGNOSIS — D229 Melanocytic nevi, unspecified: Secondary | ICD-10-CM

## 2019-03-26 DIAGNOSIS — C44519 Basal cell carcinoma of skin of other part of trunk: Secondary | ICD-10-CM | POA: Diagnosis not present

## 2019-03-26 DIAGNOSIS — W57XXXA Bitten or stung by nonvenomous insect and other nonvenomous arthropods, initial encounter: Secondary | ICD-10-CM

## 2019-03-26 DIAGNOSIS — D225 Melanocytic nevi of trunk: Secondary | ICD-10-CM

## 2019-03-26 DIAGNOSIS — Z85828 Personal history of other malignant neoplasm of skin: Secondary | ICD-10-CM | POA: Diagnosis not present

## 2019-03-26 DIAGNOSIS — L578 Other skin changes due to chronic exposure to nonionizing radiation: Secondary | ICD-10-CM

## 2019-03-26 DIAGNOSIS — D1801 Hemangioma of skin and subcutaneous tissue: Secondary | ICD-10-CM | POA: Diagnosis not present

## 2019-03-26 DIAGNOSIS — D485 Neoplasm of uncertain behavior of skin: Secondary | ICD-10-CM

## 2019-03-26 DIAGNOSIS — L814 Other melanin hyperpigmentation: Secondary | ICD-10-CM | POA: Diagnosis not present

## 2019-03-26 DIAGNOSIS — C4441 Basal cell carcinoma of skin of scalp and neck: Secondary | ICD-10-CM

## 2019-03-26 DIAGNOSIS — L821 Other seborrheic keratosis: Secondary | ICD-10-CM

## 2019-03-26 DIAGNOSIS — Z1283 Encounter for screening for malignant neoplasm of skin: Secondary | ICD-10-CM

## 2019-03-26 NOTE — Progress Notes (Signed)
Follow-Up Visit   Subjective  Samuel Padilla is a 49 y.o. male who presents for the following: Follow-up (UBSE - 6 month follow-up) and Spots (Right posterior lower leg, back.).    The following portions of the chart were reviewed this encounter and updated as appropriate:     Review of Systems: No other skin or systemic complaints.  Objective  Well appearing patient in no apparent distress; mood and affect are within normal limits.  All skin waist up examined.  Objective  Face: Actinic changes   Objective  Right Posterior Ankle: Light pink papule.  Itchy  Objective  Trunk, Right mid back: Red papules.   Objective  Back, Chest, R arm: Well healed scar with no evidence of recurrence.   Right medial lower chest: Light pink slightly firm papule c/w hypertrophic scar  Objective  Back: Scattered tan macules.   Objective  Left spinal upper back: 4.0 mm pink brown papule with crusting      Objective  Posterior Neck: 3.0 mm flesh papule     Objective  Trunk, extremities: Tan-brown and/or pink-flesh-colored symmetric macules and papules.   Objective  Trunk, Neck: Stuck-on, waxy, tan-brown papules -- Discussed benign etiology and prognosis.   Assessment & Plan  Actinic skin damage Face  Recommend daily use of broad spectrum spf 30+ sunscreen.   Bug bite without infection, initial encounter Right Posterior Ankle  Halog Ointment Apply to AA BID until improved. Sample given.  Hemangioma of skin Trunk, Right mid back  Benign, observe.    History of basal cell carcinoma (BCC) (2) Back, Chest, R arm; Right medial lower chest  Clear. Observe for recurrence.  Recommend regular skin exams, sunscreen use, and photoprotection.   Lentigines Back  Benign, observe.    Neoplasm of uncertain behavior of skin (2) Left spinal upper back  Skin / nail biopsy Type of biopsy: tangential   Informed consent: discussed and consent obtained   Patient was  prepped and draped in usual sterile fashion: Area prepped with alcohol. Anesthesia: the lesion was anesthetized in a standard fashion   Anesthetic:  1% lidocaine w/ epinephrine 1-100,000 buffered w/ 8.4% NaHCO3 Instrument used: flexible razor blade   Hemostasis achieved with: pressure, aluminum chloride and electrodesiccation   Outcome: patient tolerated procedure well   Post-procedure details: wound care instructions given   Post-procedure details comment:  Ointment and small bandage applied  Destruction of lesion  Destruction method: electrodesiccation and curettage   Informed consent: discussed and consent obtained   Timeout:  patient name, date of birth, surgical site, and procedure verified Curettage performed in three different directions: Yes   Electrodesiccation performed over the curetted area: Yes   Lesion length (cm):  0.4 Lesion width (cm):  0.4 Margin per side (cm):  0.1 Final wound size (cm):  0.6 Hemostasis achieved with:  pressure, aluminum chloride and electrodesiccation Outcome: patient tolerated procedure well with no complications   Post-procedure details: wound care instructions given    Specimen 1 - Surgical pathology Differential Diagnosis: ISK r/o BCC Check Margins: No 4.0 mm pink brown papule with crusting   Posterior Neck  Skin / nail biopsy Type of biopsy: tangential   Informed consent: discussed and consent obtained   Patient was prepped and draped in usual sterile fashion: Area prepped with alcohol. Anesthesia: the lesion was anesthetized in a standard fashion   Anesthetic:  1% lidocaine w/ epinephrine 1-100,000 buffered w/ 8.4% NaHCO3 Instrument used: flexible razor blade   Hemostasis achieved with: pressure,  aluminum chloride and electrodesiccation   Outcome: patient tolerated procedure well   Post-procedure details: wound care instructions given   Post-procedure details comment:  Ointment and small bandage applied  Destruction of  lesion  Destruction method: electrodesiccation and curettage   Informed consent: discussed and consent obtained   Timeout:  patient name, date of birth, surgical site, and procedure verified Curettage performed in three different directions: Yes   Electrodesiccation performed over the curetted area: Yes   Lesion length (cm):  0.3 Lesion width (cm):  0.3 Margin per side (cm):  0.1 Final wound size (cm):  0.5 Hemostasis achieved with:  pressure, aluminum chloride and electrodesiccation Outcome: patient tolerated procedure well with no complications   Post-procedure details: wound care instructions given    Specimen 2 - Surgical pathology Differential Diagnosis: Nevus vs SH r/o BCC Check Margins: No 3.0 mm flesh papule  Nevus Trunk, extremities  Seborrheic keratosis Trunk, Neck  Benign, observe.    Skin cancer screening TBSE  Return in about 6 months (around 09/26/2019) for UBSE.   IJamesetta Orleans, CMA, am acting as scribe for Brendolyn Patty, MD .

## 2019-03-26 NOTE — Patient Instructions (Signed)

## 2019-04-02 ENCOUNTER — Telehealth: Payer: Self-pay

## 2019-04-02 NOTE — Telephone Encounter (Signed)
-----   Message from Brendolyn Patty, MD sent at 03/30/2019  8:52 AM EDT ----- 1. Skin , left spinal upper back BASAL CELL CARCINOMA, NODULAR PATTERN- already txd with [facility] 2. Skin , posterior neck BASAL CELL CARCINOMA, NODULAR PATTERN- already txd with [facility]

## 2019-04-02 NOTE — Telephone Encounter (Signed)
Advised pt of bx results/sh ?

## 2019-05-01 DIAGNOSIS — Z800 Family history of malignant neoplasm of digestive organs: Secondary | ICD-10-CM | POA: Diagnosis not present

## 2019-05-01 DIAGNOSIS — Z86010 Personal history of colonic polyps: Secondary | ICD-10-CM | POA: Diagnosis not present

## 2019-05-01 DIAGNOSIS — Z8571 Personal history of Hodgkin lymphoma: Secondary | ICD-10-CM | POA: Diagnosis not present

## 2019-05-01 DIAGNOSIS — I2510 Atherosclerotic heart disease of native coronary artery without angina pectoris: Secondary | ICD-10-CM | POA: Diagnosis not present

## 2019-05-03 ENCOUNTER — Other Ambulatory Visit: Payer: Self-pay | Admitting: Physician Assistant

## 2019-05-04 ENCOUNTER — Other Ambulatory Visit: Payer: Self-pay | Admitting: *Deleted

## 2019-05-04 ENCOUNTER — Encounter: Payer: Self-pay | Admitting: *Deleted

## 2019-05-04 NOTE — Patient Outreach (Addendum)
[facility]) Care Management [place] Telephone Outreach, Routine insurance referral Patient not eligible for program   05/04/2019  IKAIKA BHATIA 02/16/1970 [id number]  2:20 pm: addendum: was contacted by [facility] CM leadership team to confirm that patient is not eligible for [employer] HTN initiatve, as he does not have a PCP in network for program; contacted patient and made him aware of same; patient verbalized understanding; explained that I would send him the educational material as we discussed this morning, and encouraged patient to contact [facility] CM should he obtain a PCP in the future and wish to participate in the [facility] CM program.  ---------------------------------------------------------------------------- 10:40 am:  Successful telephone outreach to Samuel Padilla, 49 y/o male referred to [place] by [facility] CMA for insurance referral/ hypertenion initiative.  Patient has history including, but not limited to, Non-Hodgkins Lymphoma as a youth (now in remission); benign essential HTN; HLD; CAD; GERD; and depression.  Patient has had no hospitalizations.  Purpose of call/ [facility] CM services discussed with patient who provides verbal consent to [facility] CM program/ involvement in his care.  Patient denies clinical concerns/ issues/ problems today.  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  Denies significant pain, states has tendonitis in (L) elbow and occasional knee pain; denies new/ recent falls and sounds to be in no distress throughout phone call today.  Patient further reports:  -- self manages medications; reports accurate report of medications according to  review against EHR; verbalizes good general understanding of the purpose, dosing and scheduling of his medications: medications were thoroughly reviewed with patient today and he denies concerns around medications; reports uses pill box.  -- denies community resource needs: SDOH completed for depression/ transportation/ food insecurity and no concerns were identified; patient lives with his spouse and 36 y/o son and reports completely independent in all ADL's and iADL's  -- does not currently have Advanced Directives in place for Living Will/ HCPOA: basics of creating these documents were discussed with patient who endorses that he is a full code; will place educational material/ planning packet in mail to patient  -- self health management of HTN/ CAD: ---- does not currently monitor blood pressures at home: value of beginning same discussed with patient; discussed value of recording home blood pressures and encouraged him to monitor/ record at various times of week, depending on activity; both at rest and during times of activity/ stress; patient agrees to monitor blood pressures at least three times per week ---- nutritional assessment completed: "tries" to follow low salt diet; although admits this is difficult when he is at work, due to the 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  I provided and confirmed that patient has my direct phone number, the main [facility] CM office phone number, and the [facility] CM 24-hour nurse advice phone number should issues arise prior to next scheduled [place] outreach.  Encouraged patient to contact me directly if needs, questions, issues, or concerns arise prior to next  scheduled outreach; patient agreed to do so.  Plan:  Patient will take medications as prescribed and will attend all scheduled provider  appointments  Patient will begin monitoring/ recording blood pressures at least three times per week   I will place [employer] blood pressure monitor and educational material in mail to patient and patient will review  [place] outreach to continue with scheduled phone call next month  [facility] CM Care Plan Problem One     Most Recent Value  Care Plan Problem One  Self- health management of chronic disease state of HTN, as evidenced by patient reporting  Role Documenting the Problem One  Care Management Coordinator  Care Plan for Problem One  Active  THN Long Term Goal   Over the next 60 days, patient will begin monitoring and recording blood pressures at home at least three times per week, as evidenced by review of same with patient during [facility] RN CM outreach  [facility] Long Term Goal Start Date  05/04/19  Interventions for Problem One Long Term Goal  Discussed with patient THN CM services, [employer] HTN initiative referral,  initiated [facility] CM initial assessment,  placed BP monitor and printed educatinal material in mail to patient,  discussed with patient value of monitoring and recording blood pressures at home at least three times a week, at different time sof day  THN CM Short Term Goal #1   Over the next 30 days, patient will review printed educational material around HTN/ BP control, mailed to him today  [facility] CM Short Term Goal #1 Start Date  05/04/19  Interventions for Short Term Goal #1  Placed printed EMMI educaitonal material in mail to patient along with [employer] BP monitor and encouraged patient to promptly review for further discussion at next outreach     I appreciate the opportunity to participate in [place] care,  Oneta Rack, RN, BSN, [employer] Coordinator [facility] Care Management  [phone]

## 2019-05-24 ENCOUNTER — Ambulatory Visit: Payer: 59 | Admitting: *Deleted

## 2019-06-12 ENCOUNTER — Other Ambulatory Visit: Payer: Self-pay | Admitting: Physician Assistant

## 2019-06-12 DIAGNOSIS — K6389 Other specified diseases of intestine: Secondary | ICD-10-CM | POA: Diagnosis not present

## 2019-06-12 DIAGNOSIS — Z1211 Encounter for screening for malignant neoplasm of colon: Secondary | ICD-10-CM | POA: Diagnosis not present

## 2019-06-12 DIAGNOSIS — Z86010 Personal history of colonic polyps: Secondary | ICD-10-CM | POA: Diagnosis not present

## 2019-06-12 DIAGNOSIS — D124 Benign neoplasm of descending colon: Secondary | ICD-10-CM | POA: Diagnosis not present

## 2019-06-12 DIAGNOSIS — K621 Rectal polyp: Secondary | ICD-10-CM | POA: Diagnosis not present

## 2019-06-12 DIAGNOSIS — K5730 Diverticulosis of large intestine without perforation or abscess without bleeding: Secondary | ICD-10-CM | POA: Diagnosis not present

## 2019-06-12 DIAGNOSIS — K635 Polyp of colon: Secondary | ICD-10-CM | POA: Diagnosis not present

## 2019-06-12 DIAGNOSIS — F419 Anxiety disorder, unspecified: Secondary | ICD-10-CM

## 2019-06-12 DIAGNOSIS — Z800 Family history of malignant neoplasm of digestive organs: Secondary | ICD-10-CM | POA: Diagnosis not present

## 2019-06-12 DIAGNOSIS — K644 Residual hemorrhoidal skin tags: Secondary | ICD-10-CM | POA: Diagnosis not present

## 2019-06-12 DIAGNOSIS — D122 Benign neoplasm of ascending colon: Secondary | ICD-10-CM | POA: Diagnosis not present

## 2019-07-03 ENCOUNTER — Ambulatory Visit: Payer: Self-pay

## 2019-07-03 ENCOUNTER — Other Ambulatory Visit: Payer: Self-pay

## 2019-07-03 DIAGNOSIS — S00411D Abrasion of right ear, subsequent encounter: Secondary | ICD-10-CM

## 2019-07-11 NOTE — Progress Notes (Signed)
Presents to have right ear evaluated.  Checked with otoscope & no redness or FB observed in canal.  Eardrum intact - no S/Sx of infection.  Right outer ear had a small pinpoint scabbed area - appeared to have scratched area.  Concerned because of skin cancer history. Established with dermatologist. States no longer discomfort to the area. Encouraged to watch for the next couple of days & if discomfort returns to contact his dermatologist.  AMD

## 2019-07-17 DIAGNOSIS — Z8571 Personal history of Hodgkin lymphoma: Secondary | ICD-10-CM | POA: Diagnosis not present

## 2019-07-17 DIAGNOSIS — I10 Essential (primary) hypertension: Secondary | ICD-10-CM | POA: Diagnosis not present

## 2019-07-17 DIAGNOSIS — Z9189 Other specified personal risk factors, not elsewhere classified: Secondary | ICD-10-CM | POA: Diagnosis not present

## 2019-07-17 DIAGNOSIS — I2510 Atherosclerotic heart disease of native coronary artery without angina pectoris: Secondary | ICD-10-CM | POA: Diagnosis not present

## 2019-07-17 DIAGNOSIS — E782 Mixed hyperlipidemia: Secondary | ICD-10-CM | POA: Diagnosis not present

## 2019-07-31 ENCOUNTER — Other Ambulatory Visit: Payer: Self-pay

## 2019-08-10 ENCOUNTER — Other Ambulatory Visit: Payer: Self-pay | Admitting: *Deleted

## 2019-08-10 ENCOUNTER — Encounter: Payer: Self-pay | Admitting: *Deleted

## 2019-08-10 NOTE — Patient Outreach (Signed)
[facility]) Care Management [place] Telephone Outreach, new patient, insurance referral  08/10/2019  JUERGEN HARDENBROOK 04-Mar-1970 [id number]  Successful telephone outreach to [name], 49 y/o male referred to [place] by [facility] CMA for insurance referral/ hypertenion initiative.  Patient has history including, but not limited to, Non-Hodgkins Lymphoma as a youth (now in remission); benign essential HTN; HLD; CAD; GERD; and depression.  Patient has had no hospitalizations.  Patient had been outreached previously 05/04/19 and since then, received communication from [place] that patient is in fact eligible for HTN initiative through his insurance provider.  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  He confirms today that he received previously mailed [facility] CM letter and printed educational material, and declines need for additional information at this time.  Contracted verbally that I would contact him as requested on Monday 08/13/19.  Plan:  [facility] CM outreach to continue with phone call on Monday 08/13/19 as requested by patient today  Oneta Rack, RN, BSN, CCRN [facility] Care Management  [phone]

## 2019-08-13 ENCOUNTER — Other Ambulatory Visit: Payer: Self-pay | Admitting: *Deleted

## 2019-08-13 ENCOUNTER — Encounter: Payer: Self-pay | Admitting: *Deleted

## 2019-08-13 NOTE — Patient Outreach (Signed)
[facility] [facility]) Care Management [place] Telephone Outreach, insurance referral, new patient  08/13/2019  Samuel Padilla Aug 21, 1970 [id number]  Successful telephone outreach to Samuel Padilla, 49 y/o male referred to [place] by [facility] CMA for insurance referral/ hypertenion initiative.  Patient has history including, but not limited to, Non-Hodgkins Lymphoma as a youth (now in remission); benign essential HTN; HLD; CAD; GERD; and depression. Patient has had no hospitalizations.  Patient had been outreached previously 05/04/19 and since then, received communication from [place] that patient is in fact eligible for HTN initiative through his insurance provider.  HIPAA/ identity verified and patient reports "doing well;" he denies clinical concerns or concerns with medications and confirms that he continues self-managing medications.  Screening/ initial assessment completed today; no concerns identified by patient or by [facility] RN CM.  Patient declines need for additional printed EMMI educational material; states he will re-review previously mailed educational material from 05/04/19 and will begin monitoring/ recording blood pressures at home once he receives blood pressure cuff through his insurance provider's initiative.  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  I re-provided/ confirmed that patient  has my direct phone number, the main THN CM office phone number, and the [facility] CM 24-hour nurse advice phone number should issues arise prior to next scheduled THN CM outreach in 3-4 weeks; discussed eventual transfer to [place] for ongoing disease management- he is agreeable.  Encouraged patient to contact me directly if needs, questions, issues, or concerns arise prior to next outreach; patient agreed to do so.  Plan:  Patient will take medications as prescribed and will attend all scheduled provider appointments  Patient will promptly notify care providers for any new concerns/ issues/ problems that arise  Patient will begin monitoring/ recording blood pressures at home, both with activity and while at rest  Patient wil continue incorporating heart healthy, low salt diet into his lifestyle  Patient will continue monitoring/ recording weekly weights   Unable to make PCP aware of THN CM involvement in patient's care-- PCP is through police department  [place] outreach to continue with follow up phone call in 3-4 weeks, possibility for transfer to [place], RN, BSN, [place] Care Management  [phone]

## 2019-08-15 ENCOUNTER — Telehealth: Payer: Self-pay

## 2019-08-16 NOTE — Telephone Encounter (Signed)
RX refill

## 2019-08-30 ENCOUNTER — Ambulatory Visit: Payer: Self-pay

## 2019-08-30 ENCOUNTER — Other Ambulatory Visit: Payer: Self-pay

## 2019-08-30 DIAGNOSIS — H6123 Impacted cerumen, bilateral: Secondary | ICD-10-CM

## 2019-08-30 NOTE — Progress Notes (Signed)
Presents to [facility] clinic with C/O "feeling like something is in his right ear".  Assessed right & left ears - both canals have wax build-up - not a complete blockage.  No FB seen in either ear.  States has some of the ear wax softener drops.  Advised to use today & the next 3 days.  If no better using the drops & still bothers him to call the office Monday to schedule appt for irrigation. Verbalized understanding.  AMD

## 2019-09-03 ENCOUNTER — Other Ambulatory Visit: Payer: Self-pay | Admitting: *Deleted

## 2019-09-03 ENCOUNTER — Encounter: Payer: Self-pay | Admitting: *Deleted

## 2019-09-03 NOTE — Patient Outreach (Signed)
[facility]) Care Management THN CM Telephone Outreach, Transfer to [place] Unsuccessful (consecutive) outreach attempt # 1- previously engaged patient  09/03/2019  [name] May 22, 1970 [id number]  09:30 am:  Unsuccessful telephone outreach to Samuel Padilla, 49 y/o male referred to [place] by [facility] CMA for insurance referral/ hypertenion initiative.  Patient has history including, but not limited to, Non-Hodgkins Lymphoma as a youth (now in remission); benign essential HTN; HLD; CAD; GERD; and depression. Patient has had no hospitalizations.  HIPAA compliant voice mail message left for patient, requesting return call back.  Plan:  Will re-attempt THN CM telephone outreach within 4 business days if I do not hear back from patient first.  Oneta Rack, RN, BSN, [place] Coordinator [facility] Care Management  [phone]

## 2019-09-06 ENCOUNTER — Encounter: Payer: Self-pay | Admitting: *Deleted

## 2019-09-06 ENCOUNTER — Other Ambulatory Visit: Payer: Self-pay | Admitting: *Deleted

## 2019-09-06 NOTE — Patient Outreach (Signed)
[facility]) Care Management THN CM Telephone Outreach Unsuccessful (consecutive) outreach attempt # 2- previously engaged patient  09/06/2019  Samuel Padilla [birth year] [id number]  2:00 pm:  Unsuccessful (consecutive) second telephone outreach attempt to [name], 49 y/o male referred to [place] by [facility] CMA for insurance referral/ hypertension initiative.  Patient has history including, but not limited to, Non-Hodgkins Lymphoma as a youth (now in remission); benign essential HTN; HLD; CAD; GERD; and depression. Patient has had no hospitalizations.  HIPAA compliant voice mail message left for patient, requesting return call back.  Plan:  Will place [facility] CM Unsuccessful outreach letter in mail to patient, requesting call back in writing  Will re-attempt [place] telephone outreach within 4 business days if I do not hear back from patient first  Oneta Rack, RN, BSN, [employer] Coordinator [facility] Care Management  [phone]

## 2019-09-11 ENCOUNTER — Other Ambulatory Visit: Payer: Self-pay

## 2019-09-11 ENCOUNTER — Ambulatory Visit (INDEPENDENT_AMBULATORY_CARE_PROVIDER_SITE_OTHER): Payer: 59 | Admitting: Dermatology

## 2019-09-11 ENCOUNTER — Encounter: Payer: Self-pay | Admitting: Dermatology

## 2019-09-11 DIAGNOSIS — L821 Other seborrheic keratosis: Secondary | ICD-10-CM | POA: Diagnosis not present

## 2019-09-11 DIAGNOSIS — L910 Hypertrophic scar: Secondary | ICD-10-CM | POA: Diagnosis not present

## 2019-09-11 DIAGNOSIS — L578 Other skin changes due to chronic exposure to nonionizing radiation: Secondary | ICD-10-CM

## 2019-09-11 DIAGNOSIS — L814 Other melanin hyperpigmentation: Secondary | ICD-10-CM | POA: Diagnosis not present

## 2019-09-11 DIAGNOSIS — Z1283 Encounter for screening for malignant neoplasm of skin: Secondary | ICD-10-CM | POA: Diagnosis not present

## 2019-09-11 DIAGNOSIS — D1800 Hemangioma unspecified site: Secondary | ICD-10-CM

## 2019-09-11 DIAGNOSIS — L219 Seborrheic dermatitis, unspecified: Secondary | ICD-10-CM

## 2019-09-11 DIAGNOSIS — D229 Melanocytic nevi, unspecified: Secondary | ICD-10-CM | POA: Diagnosis not present

## 2019-09-11 DIAGNOSIS — L820 Inflamed seborrheic keratosis: Secondary | ICD-10-CM | POA: Diagnosis not present

## 2019-09-11 DIAGNOSIS — D225 Melanocytic nevi of trunk: Secondary | ICD-10-CM | POA: Diagnosis not present

## 2019-09-11 DIAGNOSIS — Z85828 Personal history of other malignant neoplasm of skin: Secondary | ICD-10-CM | POA: Diagnosis not present

## 2019-09-11 MED ORDER — HYDROCORTISONE 2.5 % EX CREA: TOPICAL_CREAM | CUTANEOUS | 0 refills | Status: DC

## 2019-09-11 NOTE — Progress Notes (Signed)
   Follow-Up Visit   Subjective  Samuel Padilla is a 49 y.o. male who presents for the following: Annual Exam (Hx of [place] - patient has noticed new lesions on his scalp, B/L temples, and R ear that he would like checked). Scaly area comes and goes in R ear.  Spot on scalp gets irritated.  Spots on temples are new but not bothersome.   The following portions of the chart were reviewed this encounter and updated as appropriate:     Review of Systems:  No other skin or systemic complaints except as noted in HPI or Assessment and Plan.  Objective  Well appearing patient in no apparent distress; mood and affect are within normal limits.  A full examination was performed including scalp, head, eyes, ears, nose, lips, neck, chest, axillae, abdomen, back, buttocks, bilateral upper extremities, bilateral lower extremities, hands, feet, fingers, toes, fingernails, and toenails. All findings within normal limits unless otherwise noted below.  Objective  R med lower chest: Firm flesh papule  Objective  Vertex/scalp x 1: Erythematous keratotic or waxy stuck-on papule   Objective  R ear canal: Pink scaliness  Assessment & Plan  Hypertrophic scar R med lower chest  Secondary to [place] tx - no recurrence BCC benign, observe  Inflamed seborrheic keratosis Vertex/scalp x 1  Destruction of lesion - Vertex/scalp x 1  Destruction method: cryotherapy   Informed consent: discussed and consent obtained   Lesion destroyed using liquid nitrogen: Yes   Region frozen until ice ball extended beyond lesion: Yes   Outcome: patient tolerated procedure well with no complications   Post-procedure details: wound care instructions given    Seborrheic dermatitis R ear canal  Start Hydrocortisone 2.5% cream BID PRN.  Recheck on f/up  hydrocortisone 2.5 % cream - R ear canal   Lentigines - face, trunk, extremities - Scattered tan macules - Discussed due to sun exposure - Benign, observe - Call for any  changes  Seborrheic Keratoses - B/L temples, trunk, extremities, face  - Stuck-on, waxy, tan-brown papules and plaques  - Discussed benign etiology and prognosis. - Observe - Call for any changes  Melanocytic Nevi - trunk, extremities  - Tan-brown and/or pink-flesh-colored symmetric macules and papules - Benign appearing on exam today - Observation - Call clinic for new or changing moles - Recommend daily use of broad spectrum spf 30+ sunscreen to sun-exposed areas.   Hemangiomas - trunk, L mid back  - Red papules - Discussed benign nature - Observe - Call for any changes  Actinic Damage - trunk, extremities - diffuse erythematous macules with underlying dyspigmentation - Recommend daily broad spectrum sunscreen SPF 30+ to sun-exposed areas, reapply every 2 hours as needed.  - Call for new or changing lesions.  History of Basal Cell Carcinoma of the Skin - numerous, see history  - No evidence of recurrence today - Recommend regular full body skin exams - Recommend daily broad spectrum sunscreen SPF 30+ to sun-exposed areas, reapply every 2 hours as needed.  - Call if any new or changing lesions are noted between office visits  Skin cancer screening performed today.  Return in about 6 months (around 03/10/2020) for TBSE.  Luther Redo, CMA, am acting as scribe for Brendolyn Patty, MD .  Documentation: I have reviewed the above documentation for accuracy and completeness, and I agree with the above.  Brendolyn Patty MD

## 2019-09-11 NOTE — Patient Instructions (Signed)

## 2019-09-12 ENCOUNTER — Encounter: Payer: Self-pay | Admitting: *Deleted

## 2019-09-12 ENCOUNTER — Other Ambulatory Visit: Payer: Self-pay | Admitting: *Deleted

## 2019-09-12 NOTE — Patient Outreach (Signed)
[facility] [facility]) Care Management THN CM Telephone Outreach Unsuccessful (consecutive) outreach attempt # 3- previously engaged patient  09/12/2019  SILVERIO HAGAN January 12, 1970 [id number]  Unsuccessful (consecutive) third telephone outreach attempt to [name], 49 y/o male referred to [place] by [facility] CMA for insurance referral/ hypertension initiative.  Patient has history including, but not limited to, Non-Hodgkins Lymphoma as a youth (now in remission); benign essential HTN; HLD; CAD; GERD; and depression. Patient has had no hospitalizations.  HIPAA compliant voice mail message left for patient, requesting return call back.  Plan:  Verified THN CM Unsuccessful outreach letter in mail to patient, requesting call back in writing, on 09/06/19  Will re-attempt THN CM telephone outreachin 3-4 weeks for final attempt if I do not hear back from patient first  Oneta Rack, RN, BSN, [employer] Coordinator [facility] Care Management  [phone]

## 2019-09-20 DIAGNOSIS — M7672 Peroneal tendinitis, left leg: Secondary | ICD-10-CM | POA: Diagnosis not present

## 2019-09-20 DIAGNOSIS — M79672 Pain in left foot: Secondary | ICD-10-CM | POA: Diagnosis not present

## 2019-10-04 ENCOUNTER — Other Ambulatory Visit: Payer: Self-pay | Admitting: *Deleted

## 2019-10-04 ENCOUNTER — Encounter: Payer: Self-pay | Admitting: *Deleted

## 2019-10-04 NOTE — Patient Outreach (Addendum)
[facility] [facility]) Care Management [facility] CM Telephone Outreach, Case Closure  10/04/2019  Samuel Padilla 1970-05-29 [id number]  Successfultelephone outreach attemptto [name], 49 y/o male referred to [facility] CM by [facility] CMA for insurance referral/ hypertension initiative.  Patient has history including, but not limited to, Non-Hodgkins Lymphoma as a youth (now in remission); benign essential HTN; HLD; CAD; GERD; and depression. Patient has had no hospitalizations.  Confirmed with patient that he received BP cuff through insurance initiative.  Patient reports he used weekly at first, and due to ongoing work/ life schedule has not been using it very much lately.  States, "I check it every now and then, it is always normal."  Reports no concerns around his blood pressure values when he does check it at home- encouraged patient to use consistently and to record to share with care providers, he states he will consider.  Continues to verbalize ongoing efforts to slow purposeful weight loss.  Offered patient referral to [place] for quarterly outreach, however, patient declines stating that due to his life/work schedule he is unsure if he will be able to commit to program engagement. Encouraged patient to feel free to contact [facility] CM services in the future if he changes his mind and wishes to participate.  He is agreeable to this and confirms that he has contact information for [facility] CM.  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

## 2019-10-24 ENCOUNTER — Ambulatory Visit: Payer: Self-pay

## 2019-10-24 ENCOUNTER — Other Ambulatory Visit: Payer: Self-pay

## 2019-10-24 DIAGNOSIS — Z0283 Encounter for blood-alcohol and blood-drug test: Secondary | ICD-10-CM

## 2019-10-24 NOTE — Progress Notes (Signed)
Pt presented today to complete random UDS and ETOH. CL,RMA

## 2019-10-24 NOTE — Progress Notes (Signed)
Presents to [place] clinic for Random Drug Screen & Alcohol Screen. [facility] acct # [id number] [facility] Specimen # [id number]  AMD

## 2019-10-25 ENCOUNTER — Ambulatory Visit: Payer: Self-pay

## 2019-10-25 DIAGNOSIS — Z23 Encounter for immunization: Secondary | ICD-10-CM

## 2020-01-10 DIAGNOSIS — Z01818 Encounter for other preprocedural examination: Secondary | ICD-10-CM

## 2020-01-28 ENCOUNTER — Ambulatory Visit: Payer: Self-pay

## 2020-01-28 DIAGNOSIS — Z0000 Encounter for general adult medical examination without abnormal findings: Secondary | ICD-10-CM

## 2020-01-28 DIAGNOSIS — Z01818 Encounter for other preprocedural examination: Secondary | ICD-10-CM

## 2020-01-28 LAB — POCT URINALYSIS DIPSTICK
Bilirubin, UA: NEGATIVE
Blood, UA: POSITIVE
Glucose, UA: NEGATIVE
Ketones, UA: NEGATIVE
Leukocytes, UA: NEGATIVE
Nitrite, UA: NEGATIVE
Protein, UA: NEGATIVE
Spec Grav, UA: >=1.03 — AB (ref 1.010–1.025)
Urobilinogen, UA: 0.2 E.U./dL
pH, UA: 6 (ref 5.0–8.0)

## 2020-01-28 NOTE — Progress Notes (Signed)
Scheduled to complete physical 02/04/20 with Dr. Noreene Larsson.  AMD

## 2020-01-29 LAB — CMP12+LP+TP+TSH+6AC+PSA+CBC…
ALT: 17 IU/L (ref 0–44)
AST: 15 IU/L (ref 0–40)
Albumin/Globulin Ratio: 2 (ref 1.2–2.2)
Albumin: 4.2 g/dL (ref 4.0–5.0)
Alkaline Phosphatase: 105 IU/L (ref 44–121)
BUN/Creatinine Ratio: 12 (ref 9–20)
BUN: 12 mg/dL (ref 6–24)
Basophils Absolute: 0 10*3/uL (ref 0.0–0.2)
Basos: 1 %
Bilirubin Total: 0.8 mg/dL (ref 0.0–1.2)
Calcium: 8.8 mg/dL (ref 8.7–10.2)
Chloride: 104 mmol/L (ref 96–106)
Chol/HDL Ratio: 4.2 ratio (ref 0.0–5.0)
Cholesterol, Total: 134 mg/dL (ref 100–199)
Creatinine, Ser: 1.02 mg/dL (ref 0.76–1.27)
EOS (ABSOLUTE): 0.2 10*3/uL (ref 0.0–0.4)
Eos: 3 %
Estimated CHD Risk: 0.8 times avg. (ref 0.0–1.0)
Free Thyroxine Index: 2.6 (ref 1.2–4.9)
GFR calc Af Amer: 99 mL/min/{1.73_m2} (ref >59)
GFR calc non Af Amer: 86 mL/min/{1.73_m2} (ref >59)
GGT: 29 IU/L (ref 0–65)
Globulin, Total: 2.1 g/dL (ref 1.5–4.5)
Glucose: 93 mg/dL (ref 65–99)
HDL: 32 mg/dL — ABNORMAL LOW (ref >39)
Hematocrit: 50.5 % (ref 37.5–51.0)
Hemoglobin: 16.9 g/dL (ref 13.0–17.7)
Immature Grans (Abs): 0 10*3/uL (ref 0.0–0.1)
Immature Granulocytes: 1 %
Iron: 75 ug/dL (ref 38–169)
LDH: 161 IU/L (ref 121–224)
LDL Chol Calc (NIH): 82 mg/dL (ref 0–99)
Lymphocytes Absolute: 1.6 10*3/uL (ref 0.7–3.1)
Lymphs: 18 %
MCH: 31.6 pg (ref 26.6–33.0)
MCHC: 33.5 g/dL (ref 31.5–35.7)
MCV: 94 fL (ref 79–97)
Monocytes Absolute: 0.7 10*3/uL (ref 0.1–0.9)
Monocytes: 8 %
Neutrophils Absolute: 5.9 10*3/uL (ref 1.4–7.0)
Neutrophils: 69 %
Phosphorus: 2.6 mg/dL — ABNORMAL LOW (ref 2.8–4.1)
Platelets: 240 10*3/uL (ref 150–450)
Potassium: 4.4 mmol/L (ref 3.5–5.2)
Prostate Specific Ag, Serum: 0.9 ng/mL (ref 0.0–4.0)
RBC: 5.35 x10E6/uL (ref 4.14–5.80)
RDW: 12.4 % (ref 11.6–15.4)
Sodium: 142 mmol/L (ref 134–144)
T3 Uptake Ratio: 25 % (ref 24–39)
T4, Total: 10.3 ug/dL (ref 4.5–12.0)
TSH: 1.37 u[IU]/mL (ref 0.450–4.500)
Total Protein: 6.3 g/dL (ref 6.0–8.5)
Triglycerides: 105 mg/dL (ref 0–149)
Uric Acid: 6.6 mg/dL (ref 3.8–8.4)
VLDL Cholesterol Cal: 20 mg/dL (ref 5–40)
WBC: 8.5 10*3/uL (ref 3.4–10.8)

## 2020-02-04 ENCOUNTER — Ambulatory Visit: Payer: Self-pay | Admitting: Adult Medicine

## 2020-02-04 ENCOUNTER — Other Ambulatory Visit: Payer: Self-pay

## 2020-02-04 VITALS — BP 119/77 | HR 82 | Temp 98.3°F | Resp 14 | Ht 72.0 in | Wt 236.0 lb

## 2020-02-04 DIAGNOSIS — Z0000 Encounter for general adult medical examination without abnormal findings: Secondary | ICD-10-CM

## 2020-02-04 NOTE — Progress Notes (Signed)
HISTORY  Chief Complaint Annual Exam   HPI Samuel Padilla is a 50 y.o. male   denies complaint except did not blood spec in semem, reports for annual exam, chart review required from cardiology, dermatology specialist   Past Medical History:  Diagnosis Date  . Allergy   . Anxiety   . Basal cell carcinoma 06/04/2009   Right post. arm  . Basal cell carcinoma 11/23/2013   right lat. chest. Superficial BCC, Pinkus type.  . Basal cell carcinoma 11/23/2013   left med. post. shoulder. Nodular  . Basal cell carcinoma 11/23/2013   right spinal upper back. Superficial  . Basal cell carcinoma 11/23/2013   left spinal upper back. Superficial.  . Basal cell carcinoma 02/17/2016   spinal mid upper back. Superficial  . Basal cell carcinoma 08/24/2016   right upper back. Superficial  . Basal cell carcinoma 03/13/2018   medial lower chest. Superficial, nodular  . CAD (coronary artery disease)   . Colon polyps   . Depression   . Dysplastic nevus 06/04/2009   Left post. shoulder. Moderate atypia.  . Elevated lipids   . Family history of colon cancer   . GERD (gastroesophageal reflux disease)   . Hodgkin's lymphoma ([place])   . Hypothyroidism   . Overweight     Patient Active Problem List   Diagnosis Date Noted  . Patellofemoral dysfunction of right knee 09/07/2018  . Anserine bursitis 09/07/2018  . Hypothyroidism 01/09/2018  . Excessive daytime sleepiness 12/02/2016  . At risk for cardiac dysfunction 07/13/2016  . Benign essential HTN 08/07/2015  . Mixed hyperlipidemia 08/07/2015  . Shortness of breath 08/07/2015   review of old chart - excessive daytime sleepiness  Past Surgical History:  Procedure Laterality Date  . COLONOSCOPY  06/05/2014   repeat 2019  . ESOPHAGOGASTRODUODENOSCOPY  06/05/2014  . TUNNELED VENOUS CATHETER PLACEMENT  1989    Prior to Admission medications   Medication Sig Start Date End Date Taking? Authorizing Provider  aspirin EC 81 MG tablet Take 81  mg by mouth in the morning and at bedtime. Swallow whole.   Yes [provider]  atorvastatin (LIPITOR) 80 MG tablet Take by mouth. 10/03/17 02/04/20 Yes [provider]  famotidine (PEPCID) 20 MG tablet Take 20 mg by mouth daily.   Yes [provider]  FLUoxetine (PROZAC) 20 MG capsule Take 1 capsule (20 mg total) by mouth daily. 08/01/19  Yes Johnn Hai, PA-C  levothyroxine (SYNTHROID) 100 MCG tablet TAKE 1 TABLET BY MOUTH DAILY BEFORE BREAKFAST 08/15/19  Yes Sable Feil, PA-C  metoprolol succinate (TOPROL-XL) 25 MG 24 hr tablet Take by mouth. 10/03/17 02/04/20 Yes [provider]  hydrocortisone 2.5 % cream Apply to aa right ear BID PRN. 09/11/19   Brendolyn Patty, MD    Allergies Cheese, Erythromycin, and Lac bovis  ( anaphylaxis with cheese)  Family History  Problem Relation Age of Onset  . Diverticulitis Mother   . Colon cancer Father   . Melanoma Father   . COPD Father     Social History Social History   Tobacco Use  . Smoking status: Never Smoker  . Smokeless tobacco: Never Used  Substance Use Topics  . Alcohol use: No    Review of Systems Constitutional: No fever/chills Eyes: No visual changes. ENT: No sore throat. Cardiovascular: Denies chest pain. Respiratory: Denies shortness of breath. Gastrointestinal: No abdominal pain.  No nausea, no vomiting.  No diarrhea.  No constipation. Genitourinary: Negative for dysuria. Musculoskeletal: Negative  for back pain. Skin: Negative for rash. Neurological: Negative for headaches, focal weakness or numbness. Psychiatric:    ____________________________________________   PHYSICAL EXAM:  VITAL SIGNS: Normotensive Bmi 30% Constitutional: Alert and oriented. Well appearing and in no acute distress. Eyes: Conjunctivae are normal. PERRL. EOMI. Head: Atraumatic. Nose: No congestion/rhinnorhea. Mouth/Throat: Mucous membranes are moist.  Oropharynx non-erythematous. Neck: No stridor. No  cervical spine tenderness to palpation Hematological/Lymphatic/Immunilogical: No cervical lymphadenopathy. Cardiovascular: Normal rate, regular rhythm. Grossly normal heart sounds.  Good peripheral circulation. Respiratory: Normal respiratory effort.  No retractions. Lungs CTAB. Gastrointestinal: Soft and nontender. Distention circ44.75i". No abdominal bruits.  Genitourinary: prostrate 3x3.5" nontender no nodules  guiac neg  Musculoskeletal: No lower extremity tenderness nor edema.  No joint effusions. Extremities: spider vein, dilated vein LE nl peripheral pulses Neurologic:  Normal speech and language. No gross focal neurologic deficits are appreciated. No gait instability. Skin:  Skin is warm, dry and intact. No rash noted. Old chest scar from catheter Psychiatric: Mood and affect are normal. Speech and behavior are normal.  ___________________________________   LABS   Sodium 142 (01/28/20) 142  143      Potassium 4.4 (01/28/20) 4.4  4.3      Chloride 104 (01/28/20) 104  105      BUN 12 (01/28/20) 12  10      Creatinine 1.02 (01/28/20) 1.02  1.12         01/28/20 12/27/18 04/12/17 04/08/16 04/08/16   Cholesterol 116 (04/12/17)   116  170   Cholesterol, Total 134 (01/28/20) 134  155     HDL Cholesterol 32Low (01/28/20) 32Low  32Low  33Abnormal  34Abnormal   LDL (calc) 61 (04/12/17)   61  108   Triglycerides 105 (01/28/20) 105  168High  111  141    Thyroid  TSH 1.370 (01/28/20) 1.370  2.650  2.49  2.130  1.720   Thyroxine (T4) 10.3 (01/28/20) 10.3  9.2   10.9  9.2   T3 Uptake 25 (04/13/17)    25  24   EKG Sinus rythym, nl axis, notched p-waves with pac, IVCD  No acute ischemia   INITIAL IMPRESSION / ASSESSMENT  Hx hyperlipidemia with hx of 65% ct scoring. hi cardiac risk, Clinical hx of snoring, truncal girth 44.75in     ekg finding of atrial ectopy Hx atypical chest pain  F/u [facility] since 2018 report no onset in last 19m Dx depression after pd, eap recommendation placed on  prozac med x6y state most of concerns     Resolved related to origin period  Hx basal cell ca x15 excised all in chest distribution of age16 lymphoma radiation tx. Continue monitoring discussed with client if  blood spot repeated will refer for urology eval  Order serum homocysteine, dhea Discussed: add to personal regimen aerobic exercise, phosphotydycholine

## 2020-02-08 LAB — DHEA: Dehydroepiandrosterone: 145 ng/dL (ref 31–701)

## 2020-02-08 LAB — HOMOCYSTEINE: Homocysteine: 10.4 umol/L (ref 0.0–14.5)

## 2020-02-25 ENCOUNTER — Other Ambulatory Visit: Payer: Self-pay | Admitting: Physician Assistant

## 2020-03-04 ENCOUNTER — Other Ambulatory Visit: Payer: Self-pay

## 2020-03-04 MED ORDER — LEVOTHYROXINE SODIUM 100 MCG PO TABS: 100.0000 ug | ORAL_TABLET | Freq: Every day | ORAL | 1 refills | Status: DC

## 2020-03-05 ENCOUNTER — Other Ambulatory Visit: Payer: Self-pay

## 2020-03-05 DIAGNOSIS — E039 Hypothyroidism, unspecified: Secondary | ICD-10-CM

## 2020-03-25 ENCOUNTER — Other Ambulatory Visit: Payer: Self-pay

## 2020-03-25 ENCOUNTER — Encounter: Payer: Self-pay | Admitting: Dermatology

## 2020-03-25 ENCOUNTER — Ambulatory Visit (INDEPENDENT_AMBULATORY_CARE_PROVIDER_SITE_OTHER): Payer: 59 | Admitting: Dermatology

## 2020-03-25 DIAGNOSIS — C4441 Basal cell carcinoma of skin of scalp and neck: Secondary | ICD-10-CM

## 2020-03-25 DIAGNOSIS — L820 Inflamed seborrheic keratosis: Secondary | ICD-10-CM

## 2020-03-25 DIAGNOSIS — Z85828 Personal history of other malignant neoplasm of skin: Secondary | ICD-10-CM | POA: Diagnosis not present

## 2020-03-25 DIAGNOSIS — D485 Neoplasm of uncertain behavior of skin: Secondary | ICD-10-CM

## 2020-03-25 DIAGNOSIS — D2239 Melanocytic nevi of other parts of face: Secondary | ICD-10-CM | POA: Diagnosis not present

## 2020-03-25 DIAGNOSIS — Z1283 Encounter for screening for malignant neoplasm of skin: Secondary | ICD-10-CM

## 2020-03-25 DIAGNOSIS — L219 Seborrheic dermatitis, unspecified: Secondary | ICD-10-CM | POA: Diagnosis not present

## 2020-03-25 DIAGNOSIS — L814 Other melanin hyperpigmentation: Secondary | ICD-10-CM

## 2020-03-25 DIAGNOSIS — C44519 Basal cell carcinoma of skin of other part of trunk: Secondary | ICD-10-CM

## 2020-03-25 DIAGNOSIS — D229 Melanocytic nevi, unspecified: Secondary | ICD-10-CM

## 2020-03-25 DIAGNOSIS — L578 Other skin changes due to chronic exposure to nonionizing radiation: Secondary | ICD-10-CM

## 2020-03-25 DIAGNOSIS — L821 Other seborrheic keratosis: Secondary | ICD-10-CM

## 2020-03-25 DIAGNOSIS — D1800 Hemangioma unspecified site: Secondary | ICD-10-CM

## 2020-03-25 NOTE — Progress Notes (Signed)
Follow-Up Visit   Subjective  Samuel Padilla is a 50 y.o. male who presents for the following: Follow-up (Patient here for 6 month UBSE. He has a history of multiple BCCs. Seborrheic Dermatitis of the ears improved with 2.5% hydrocortisone cream. No spots of concern today. ). Has spot at neck irritated by collar.   The following portions of the chart were reviewed this encounter and updated as appropriate:       Review of Systems:  No other skin or systemic complaints except as noted in HPI or Assessment and Plan.  Objective  Well appearing patient in no apparent distress; mood and affect are within normal limits.  A focused examination was performed including all skin waist up and legs. Relevant physical exam findings are noted in the Assessment and Plan.  Objective  Ears: Clear today  Objective  R lower neck x 3 (3): Erythematous keratotic or waxy stuck-on papules   Objective  R medial clavicle: 7.0 x 6.1mm pink yellow flat papule      Objective  Right Nasal Tip: 2.78mm firm pink/flesh papule   Assessment & Plan   Skin cancer screening performed today.  Actinic Damage - chronic, secondary to cumulative UV radiation exposure/sun exposure over time - diffuse scaly erythematous macules with underlying dyspigmentation - Recommend daily broad spectrum sunscreen SPF 30+ to sun-exposed areas, reapply every 2 hours as needed.  - Recommend staying in the shade or wearing long sleeves, sun glasses (UVA+UVB protection) and wide brim hats (4-inch brim around the entire circumference of the hat). - Call for new or changing lesions.  History of Basal Cell Carcinoma of the Skin - No evidence of recurrence today - Recommend regular full body skin exams - Recommend daily broad spectrum sunscreen SPF 30+ to sun-exposed areas, reapply every 2 hours as needed.  - Call if any new or changing lesions are noted between office visits  Seborrheic Keratoses - Stuck-on, waxy, tan-brown  papules and plaques  - Discussed benign etiology and prognosis. - Observe - Call for any changes  Lentigines - Scattered tan macules - Due to sun exposure - Benign-appering, observe - Recommend daily broad spectrum sunscreen SPF 30+ to sun-exposed areas, reapply every 2 hours as needed. - Call for any changes  Melanocytic Nevi - Tan-brown and/or pink-flesh-colored symmetric macules and papules - Benign appearing on exam today - Observation - Call clinic for new or changing moles - Recommend daily use of broad spectrum spf 30+ sunscreen to sun-exposed areas.   Hemangiomas - Red papules - Discussed benign nature - Observe - Call for any changes   Seborrheic dermatitis Ears  Improved.  Continue 2.5% hydrocortisone cream qd/bid prn flares.  Inflamed seborrheic keratosis (3) R lower neck x 3  Destruction of lesion - R lower neck x 3  Destruction method: cryotherapy   Informed consent: discussed and consent obtained   Lesion destroyed using liquid nitrogen: Yes   Region frozen until ice ball extended beyond lesion: Yes   Outcome: patient tolerated procedure well with no complications   Post-procedure details: wound care instructions given    Neoplasm of uncertain behavior of skin R medial clavicle  Skin / nail biopsy Type of biopsy: tangential   Informed consent: discussed and consent obtained   Patient was prepped and draped in usual sterile fashion: Area prepped with alcohol. Anesthesia: the lesion was anesthetized in a standard fashion   Anesthetic:  1% lidocaine w/ epinephrine 1-100,000 buffered w/ 8.4% NaHCO3 Instrument used: flexible razor blade  Hemostasis achieved with: pressure, aluminum chloride and electrodesiccation   Outcome: patient tolerated procedure well    Destruction of lesion  Destruction method: electrodesiccation and curettage   Informed consent: discussed and consent obtained   Timeout:  patient name, date of birth, surgical site, and  procedure verified Curettage performed in three different directions: Yes   Electrodesiccation performed over the curetted area: Yes   Lesion length (cm):  0.7 Lesion width (cm):  0.6 Margin per side (cm):  0.1 Final wound size (cm):  0.9 Hemostasis achieved with:  pressure, aluminum chloride and electrodesiccation Outcome: patient tolerated procedure well with no complications   Post-procedure details: wound care instructions given   Additional details:  Mupirocin ointment and Bandaid applied    Specimen 1 - Surgical pathology Differential Diagnosis: Sebaceous Hyperplasia vs SK r/o BCC Check Margins: No 7.0 x 6.76mm pink yellow flat papule EDC today  Fibrous papule of nose Right Nasal Tip  Benign-appearing.  Observation.  Call clinic for new or changing lesions.  Recommend daily use of broad spectrum spf 30+ sunscreen to sun-exposed areas.      Return in about 6 months (around 09/25/2020) for UBSE.   IJamesetta Orleans, CMA, am acting as scribe for Brendolyn Patty, MD .  Documentation: I have reviewed the above documentation for accuracy and completeness, and I agree with the above.  Brendolyn Patty MD

## 2020-03-25 NOTE — Patient Instructions (Addendum)
Wound Care Instructions  1. Cleanse wound gently with soap and water once a day then pat dry with clean gauze. Apply a thing coat of Petrolatum (petroleum jelly, "Vaseline") over the wound (unless you have an allergy to this). We recommend that you use a new, sterile tube of Vaseline. Do not pick or remove scabs. Do not remove the yellow or white "healing tissue" from the base of the wound.  2. Cover the wound with fresh, clean, nonstick gauze and secure with paper tape. You may use Band-Aids in place of gauze and tape if the would is small enough, but would recommend trimming much of the tape off as there is often too much. Sometimes Band-Aids can irritate the skin.  3. You should call the office for your biopsy report after 1 week if you have not already been contacted.  4. If you experience any problems, such as abnormal amounts of bleeding, swelling, significant bruising, significant pain, or evidence of infection, please call the office immediately.  5. FOR ADULT SURGERY PATIENTS: If you need something for pain relief you may take 1 extra strength Tylenol (acetaminophen) AND 2 Ibuprofen (200mg  each) together every 4 hours as needed for pain. (do not take these if you are allergic to them or if you have a reason you should not take them.) Typically, you may only need pain medication for 1 to 3 days.    Cryotherapy Aftercare  . Wash gently with soap and water everyday.   Marland Kitchen Apply Vaseline and Band-Aid daily until healed.   If you have any questions or concerns for your doctor, please call our main line at [phone] and press option 4 to reach your doctor's medical assistant. If no one answers, please leave a voicemail as directed and we will return your call as soon as possible. Messages left after 4 pm will be answered the following business day.   You may also send [place] a message via [place]. We typically respond to MyChart messages within 1-2 business days.  For prescription refills,  please ask your pharmacy to contact our office. Our fax number is [phone].  If you have an urgent issue when the clinic is closed that cannot wait until the next business day, you can page your doctor at the number below.    Please note that while we do our best to be available for urgent issues outside of office hours, we are not available 24/7.   If you have an urgent issue and are unable to reach [place], you may choose to seek medical care at your doctor's office, retail clinic, urgent care center, or emergency room.  If you have a medical emergency, please immediately call 911 or go to the emergency department.  Pager Numbers  - Dr. Nehemiah Massed: [phone]  - Dr. Laurence Ferrari: [phone]  - Dr. Nicole Kindred: [phone]  In the event of inclement weather, please call our main line at [phone] for an update on the status of any delays or closures.  Dermatology Medication Tips: Please keep the boxes that topical medications come in in order to help keep track of the instructions about where and how to use these. Pharmacies typically print the medication instructions only on the boxes and not directly on the medication tubes.   If your medication is too expensive, please contact our office at [phone] option 4 or send [place] a message through [place].   We are unable to tell what your co-pay for medications will be in advance as this is different  depending on your insurance coverage. However, we may be able to find a substitute medication at lower cost or fill out paperwork to get insurance to cover a needed medication.   If a prior authorization is required to get your medication covered by your insurance company, please allow [place] 1-2 business days to complete this process.  Drug prices often vary depending on where the prescription is filled and some pharmacies may offer cheaper prices.  The website www.goodrx.com contains coupons for medications through different pharmacies. The prices  here do not account for what the cost may be with help from insurance (it may be cheaper with your insurance), but the website can give you the price if you did not use any insurance.  - You can print the associated coupon and take it with your prescription to the pharmacy.  - You may also stop by our office during regular business hours and pick up a GoodRx coupon card.  - If you need your prescription sent electronically to a different pharmacy, notify our office through [facility] or by phone at [phone] option 4.

## 2020-03-31 ENCOUNTER — Telehealth: Payer: Self-pay

## 2020-03-31 NOTE — Telephone Encounter (Signed)
Left message for patient to call for biopsy results. 

## 2020-03-31 NOTE — Telephone Encounter (Signed)
-----   Message from Brendolyn Patty, MD sent at 03/31/2020  1:43 PM EDT ----- Skin , A) right medial clavicle,shave BASAL CELL CARCINOMA, FIBROEPITHELIOMA OF PINKUS TYPE, CLOSE TO MARGIN  BCC skin cancer- already treated with EDC at time of biopsy

## 2020-04-02 NOTE — Telephone Encounter (Signed)
Patient advised of biopsy result.

## 2020-06-10 ENCOUNTER — Other Ambulatory Visit: Payer: Self-pay | Admitting: Nurse Practitioner

## 2020-06-25 ENCOUNTER — Encounter: Payer: Self-pay | Admitting: Urology

## 2020-06-25 ENCOUNTER — Other Ambulatory Visit: Payer: Self-pay

## 2020-06-25 ENCOUNTER — Ambulatory Visit (INDEPENDENT_AMBULATORY_CARE_PROVIDER_SITE_OTHER): Payer: 59 | Admitting: Urology

## 2020-06-25 VITALS — BP 125/78 | HR 76 | Ht 72.0 in | Wt 235.0 lb

## 2020-06-25 DIAGNOSIS — R361 Hematospermia: Secondary | ICD-10-CM

## 2020-06-25 NOTE — Progress Notes (Signed)
06/25/2020 11:04 AM   Samuel Padilla 08-15-1970 [id number]  Referring provider: No referring provider defined for this encounter.  Chief Complaint  Patient presents with   Blood in semen    HPI: 50 year old male who presents today for further evaluation of hematospermia.  He reports that a few years ago, he had this happen but it was self-limited.  Over the past year, he has had this consistently with each ejaculation.  Initially started as bright red and now has been dark red without clots.  He is never had an ejaculate over the last year without blood in it.  He does take aspirin 81 mg for additional history of coronary artery disease possibly related to chest radiation from Hodgkin's lymphoma as a teenager.  He mentions today that he has a friable blood vessels which are close to the surface including varicose/spider veins in his legs as well as frequent bloody noses.  His platelets are normal.  He denies any urinary symptoms.  No urgency, frequency, dysuria, or hematuria.  Urinalysis today with no evidence of microscopic blood.  No STI exposures.  No pain with ejaculation.  No scrotal or testicular discomfort.  He does have some enlarged blood vessels in his left scrotum which are nonbothersome.  Last PSA 1.2 in 04/2017.  PMH: Past Medical History:  Diagnosis Date   Allergy    Anxiety    Basal cell carcinoma 06/04/2009   Right post. arm   Basal cell carcinoma 11/23/2013   right lat. chest. Superficial BCC, Pinkus type.   Basal cell carcinoma 11/23/2013   left med. post. shoulder. Nodular   Basal cell carcinoma 11/23/2013   right spinal upper back. Superficial   Basal cell carcinoma 11/23/2013   left spinal upper back. Superficial.   Basal cell carcinoma 02/17/2016   spinal mid upper back. Superficial   Basal cell carcinoma 08/24/2016   right upper back. Superficial   Basal cell carcinoma 03/13/2018   medial lower chest. Superficial, nodular   Basal cell  carcinoma 03/26/2019   L spinal upper back - ED&C    Basal cell carcinoma 03/26/2019   Post neck - ED&C    Basal cell carcinoma 03/26/2019   R medial clavicle, EDC   CAD (coronary artery disease)    Colon polyps    Depression    Dysplastic nevus 06/04/2009   Left post. shoulder. Moderate atypia.   Elevated lipids    Family history of colon cancer    GERD (gastroesophageal reflux disease)    Hodgkin's lymphoma ([place])    Hypothyroidism    Overweight     Surgical History: Past Surgical History:  Procedure Laterality Date   COLONOSCOPY  06/05/2014   repeat 2019   ESOPHAGOGASTRODUODENOSCOPY  06/05/2014   TUNNELED VENOUS CATHETER PLACEMENT  1989    Home Medications:  Allergies as of 06/25/2020       Reactions   Cheese Anaphylaxis   Erythromycin Other (See Comments)   Lac Bovis Swelling        Medication List        Accurate as of June 25, 2020 11:04 AM. If you have any questions, ask your nurse or doctor.          aspirin EC 81 MG tablet Take 81 mg by mouth in the morning and at bedtime. Swallow whole.   atorvastatin 80 MG tablet Commonly known as: LIPITOR atorvastatin 80 mg tablet What changed: Another medication with the same name was removed. Continue taking this medication, and  follow the directions you see here. Changed by: Hollice Espy, MD   famotidine 20 MG tablet Commonly known as: PEPCID Take 20 mg by mouth daily.   FLUoxetine 20 MG capsule Commonly known as: PROZAC Take 1 capsule (20 mg total) by mouth daily.   levothyroxine 100 MCG tablet Commonly known as: SYNTHROID Take 1 tablet (100 mcg total) by mouth daily before breakfast.   metoprolol succinate 25 MG 24 hr tablet Commonly known as: TOPROL-XL Take by mouth.        Allergies:  Allergies  Allergen Reactions   Cheese Anaphylaxis   Erythromycin Other (See Comments)   Lac Bovis Swelling    Family History: Family History  Problem Relation Age of Onset   Diverticulitis  Mother    Colon cancer Father    Melanoma Father    COPD Father     Social History:  reports that he has never smoked. He has never used smokeless tobacco. He reports that he does not drink alcohol. No history on file for drug use.   Physical Exam: BP 125/78   Pulse 76   Ht 6' (1.829 m)   Wt 235 lb (106.6 kg)   BMI 31.87 kg/m   Constitutional:  Alert and oriented, No acute distress. HEENT: [place] AT, moist mucus membranes.  Trachea midline, no masses. Cardiovascular: No clubbing, cyanosis, or edema. Respiratory: Normal respiratory effort, no increased work of breathing. GI: Abdomen is soft, nontender, nondistended, no abdominal masses GU: Circumcised phallus with orthotopic meatus.  Bilateral descended testicles are normal, no masses.  There is a grade 2 varicocele on the left. Rectal: Normal sphincter tone.  50 cc prostate, nontender, rubbery lateral lobes without nodularity or firmness. Skin: No rashes, bruises or suspicious lesions. Neurologic: Grossly intact, no focal deficits, moving all 4 extremities. Psychiatric: Normal mood and affect.  Laboratory Data: Lab Results  Component Value Date   WBC 8.5 01/28/2020   HGB 16.9 01/28/2020   HCT 50.5 01/28/2020   MCV 94 01/28/2020   PLT 240 01/28/2020    Lab Results  Component Value Date   CREATININE 1.02 01/28/2020    Lab Results  Component Value Date   PSA 1.2 04/12/2017   PSA 1.4 04/08/2016    Urinalysis Urinalysis today is negative.  Assessment & Plan:    1. Hematospermia  I explained to the patient some of the conditions that may cause hematospermia, such as: disorders of the prostate gland, seminal vesicles, spermatic cord, and ejaculatory duct system; urogenital infections including sexually transmitted infections (eg, chlamydia, herpes simplex virus, gonorrhea, trichomonas); metastatic cancers; vascular malformations; congenital and drug-induced bleeding disorders; and even frequent daily ejaculation over a  period of several weeks.  I reassured him that his exam was normal and that we may never discover a reason for his hematospermia and it is most likely a benign symptom.     That being said, we did complete PSA screening for prostate cancer.  Given the severity and consistency of his hematospermia, recommended prostate MRI to rule out any underlying pathology.  He is agreeable this plan.  We will call him with the results.  - Urinalysis, Complete  Hollice Espy, MD  [facility] [address] [place], [place] [address] [phone]

## 2020-06-26 ENCOUNTER — Telehealth: Payer: Self-pay | Admitting: *Deleted

## 2020-06-26 ENCOUNTER — Ambulatory Visit: Payer: Self-pay | Admitting: Urology

## 2020-06-26 LAB — URINALYSIS, COMPLETE
Bilirubin, UA: NEGATIVE
Glucose, UA: NEGATIVE
Ketones, UA: NEGATIVE
Leukocytes,UA: NEGATIVE
Nitrite, UA: NEGATIVE
Protein,UA: NEGATIVE
Specific Gravity, UA: 1.02 (ref 1.005–1.030)
Urobilinogen, Ur: 1 mg/dL (ref 0.2–1.0)
pH, UA: 8.5 — ABNORMAL HIGH (ref 5.0–7.5)

## 2020-06-26 LAB — MICROSCOPIC EXAMINATION
Bacteria, UA: NONE SEEN
Epithelial Cells (non renal): NONE SEEN /hpf (ref 0–10)
WBC, UA: NONE SEEN /hpf (ref 0–5)

## 2020-06-26 LAB — PSA: Prostate Specific Ag, Serum: 1 ng/mL (ref 0.0–4.0)

## 2020-06-26 NOTE — Telephone Encounter (Addendum)
Left patient a VM to return call  ----- Message from Hollice Espy, MD sent at 06/26/2020  9:13 AM EDT ----- PSA is low and stable, great news.  Hollice Espy, MD

## 2020-07-15 ENCOUNTER — Ambulatory Visit
Admission: RE | Admit: 2020-07-15 | Discharge: 2020-07-15 | Disposition: A | Discharge status: Home / Self Care | Payer: 59 | Source: Ambulatory Visit | Attending: Urology | Admitting: Urology

## 2020-07-15 ENCOUNTER — Other Ambulatory Visit: Payer: Self-pay

## 2020-07-15 DIAGNOSIS — R361 Hematospermia: Secondary | ICD-10-CM | POA: Diagnosis not present

## 2020-07-15 MED ORDER — GADOBUTROL 1 MMOL/ML IV SOLN
10.0000 mL | Freq: Once | INTRAVENOUS | Status: AC | PRN
  Administered 2020-07-15: 10 mL via INTRAVENOUS

## 2020-07-17 ENCOUNTER — Telehealth: Payer: Self-pay | Admitting: *Deleted

## 2020-07-17 NOTE — Telephone Encounter (Addendum)
Patient informed, voiced understanding   ----- Message from Hollice Espy, MD sent at 07/16/2020  4:04 PM EDT ----- Prostate MRI was personally reviewed.  It actually looks quite good, there is evidence of probable prostatitis which is an inflammatory condition of the prostate which could explain the blood in your semen.  No intervention is needed at this time.  I do not think that you need any follow-up.  There is also some blood material in your seminal vesicles which explains the blood you have seen your ejaculate few fluid .  This is very reassuring.  Overall, good report.    See you as needed,  Hollice Espy, MD

## 2020-08-20 ENCOUNTER — Ambulatory Visit: Payer: Self-pay | Admitting: Physician Assistant

## 2020-08-20 ENCOUNTER — Encounter: Payer: Self-pay | Admitting: Physician Assistant

## 2020-08-20 ENCOUNTER — Other Ambulatory Visit: Payer: Self-pay

## 2020-08-20 VITALS — BP 140/81 | HR 68

## 2020-08-20 DIAGNOSIS — B354 Tinea corporis: Secondary | ICD-10-CM

## 2020-08-20 MED ORDER — CLOTRIMAZOLE-BETAMETHASONE 1-0.05 % EX CREA: TOPICAL_CREAM | CUTANEOUS | 1 refills | Status: DC

## 2020-08-20 NOTE — Progress Notes (Signed)
   Subjective:Rash    Patient ID: Samuel Padilla, male    DOB: 1970-02-14, 50 y.o.   MRN: [id number]  HPI Patient presents with a itchy macular lesion posterior right lower leg for 1 week.  Patient believes he might be an insect bite.  Denies fever/pain/drainage.  No palliative measure for complaint.   Review of Systems Hyperlipidemia and hypertension    Objective:   Physical Exam No acute distress.  Temperature is 97.8, pulse 68, respiration 14, BP 140/81. 1 cm macular lesion with scaly borders posterior right lower leg.       Assessment & Plan: Tinea corporis   Patient given prescription for Lotrisone.  Advised application twice a day and follow-up in 5 days if no improvement.

## 2020-09-09 ENCOUNTER — Other Ambulatory Visit: Payer: Self-pay

## 2020-09-09 ENCOUNTER — Other Ambulatory Visit: Payer: Self-pay | Admitting: Nurse Practitioner

## 2020-09-09 DIAGNOSIS — F419 Anxiety disorder, unspecified: Secondary | ICD-10-CM

## 2020-09-09 MED ORDER — FLUOXETINE HCL 20 MG PO CAPS: 20.0000 mg | ORAL_CAPSULE | Freq: Every day | ORAL | 3 refills | Status: DC

## 2020-09-09 MED ORDER — LEVOTHYROXINE SODIUM 100 MCG PO TABS: 100.0000 ug | ORAL_TABLET | Freq: Every day | ORAL | 1 refills | Status: DC

## 2020-09-29 ENCOUNTER — Other Ambulatory Visit: Payer: Self-pay

## 2020-09-29 ENCOUNTER — Ambulatory Visit (INDEPENDENT_AMBULATORY_CARE_PROVIDER_SITE_OTHER): Payer: 59 | Admitting: Dermatology

## 2020-09-29 ENCOUNTER — Encounter: Payer: Self-pay | Admitting: Dermatology

## 2020-09-29 DIAGNOSIS — C4491 Basal cell carcinoma of skin, unspecified: Secondary | ICD-10-CM

## 2020-09-29 DIAGNOSIS — D485 Neoplasm of uncertain behavior of skin: Secondary | ICD-10-CM

## 2020-09-29 DIAGNOSIS — Z85828 Personal history of other malignant neoplasm of skin: Secondary | ICD-10-CM

## 2020-09-29 DIAGNOSIS — L578 Other skin changes due to chronic exposure to nonionizing radiation: Secondary | ICD-10-CM | POA: Diagnosis not present

## 2020-09-29 DIAGNOSIS — C44519 Basal cell carcinoma of skin of other part of trunk: Secondary | ICD-10-CM | POA: Diagnosis not present

## 2020-09-29 DIAGNOSIS — D2239 Melanocytic nevi of other parts of face: Secondary | ICD-10-CM | POA: Diagnosis not present

## 2020-09-29 DIAGNOSIS — D229 Melanocytic nevi, unspecified: Secondary | ICD-10-CM

## 2020-09-29 DIAGNOSIS — Z1283 Encounter for screening for malignant neoplasm of skin: Secondary | ICD-10-CM | POA: Diagnosis not present

## 2020-09-29 DIAGNOSIS — L814 Other melanin hyperpigmentation: Secondary | ICD-10-CM

## 2020-09-29 DIAGNOSIS — D1800 Hemangioma unspecified site: Secondary | ICD-10-CM

## 2020-09-29 DIAGNOSIS — L821 Other seborrheic keratosis: Secondary | ICD-10-CM

## 2020-09-29 HISTORY — DX: Basal cell carcinoma of skin, unspecified: C44.91

## 2020-09-29 NOTE — Patient Instructions (Signed)
Wound Care Instructions  Cleanse wound gently with soap and water once a day then pat dry with clean gauze. Apply a thing coat of Petrolatum (petroleum jelly, "Vaseline") over the wound (unless you have an allergy to this). We recommend that you use a new, sterile tube of Vaseline. Do not pick or remove scabs. Do not remove the yellow or white "healing tissue" from the base of the wound.  Cover the wound with fresh, clean, nonstick gauze and secure with paper tape. You may use Band-Aids in place of gauze and tape if the would is small enough, but would recommend trimming much of the tape off as there is often too much. Sometimes Band-Aids can irritate the skin.  You should call the office for your biopsy report after 1 week if you have not already been contacted.  If you experience any problems, such as abnormal amounts of bleeding, swelling, significant bruising, significant pain, or evidence of infection, please call the office immediately.  FOR ADULT SURGERY PATIENTS: If you need something for pain relief you may take 1 extra strength Tylenol (acetaminophen) AND 2 Ibuprofen (200mg each) together every 4 hours as needed for pain. (do not take these if you are allergic to them or if you have a reason you should not take them.) Typically, you may only need pain medication for 1 to 3 days.   If you have any questions or concerns for your doctor, please call our main line at [phone] and press option 4 to reach your doctor's medical assistant. If no one answers, please leave a voicemail as directed and we will return your call as soon as possible. Messages left after 4 pm will be answered the following business day.   You may also send us a message via MyChart. We typically respond to MyChart messages within 1-2 business days.  For prescription refills, please ask your pharmacy to contact our office. Our fax number is [phone].  If you have an urgent issue when the clinic is closed that  cannot wait until the next business day, you can page your doctor at the number below.    Please note that while we do our best to be available for urgent issues outside of office hours, we are not available 24/7.   If you have an urgent issue and are unable to reach us, you may choose to seek medical care at your doctor's office, retail clinic, urgent care center, or emergency room.  If you have a medical emergency, please immediately call 911 or go to the emergency department.  Pager Numbers  - Dr. Kowalski: [phone]  - Dr. Moye: [phone]  - Dr. Stewart: [phone]  In the event of inclement weather, please call our main line at [phone] for an update on the status of any delays or closures.  Dermatology Medication Tips: Please keep the boxes that topical medications come in in order to help keep track of the instructions about where and how to use these. Pharmacies typically print the medication instructions only on the boxes and not directly on the medication tubes.   If your medication is too expensive, please contact our office at [phone] option 4 or send us a message through MyChart.   We are unable to tell what your co-pay for medications will be in advance as this is different depending on your insurance coverage. However, we may be able to find a substitute medication at lower cost or fill out paperwork to get insurance to cover a needed   medication.   If a prior authorization is required to get your medication covered by your insurance company, please allow us 1-2 business days to complete this process.  Drug prices often vary depending on where the prescription is filled and some pharmacies may offer cheaper prices.  The website www.goodrx.com contains coupons for medications through different pharmacies. The prices here do not account for what the cost may be with help from insurance (it may be cheaper with your insurance), but the website can give you the  price if you did not use any insurance.  - You can print the associated coupon and take it with your prescription to the pharmacy.  - You may also stop by our office during regular business hours and pick up a GoodRx coupon card.  - If you need your prescription sent electronically to a different pharmacy, notify our office through [facility] MyChart or by phone at [phone] option 4.   

## 2020-09-29 NOTE — Progress Notes (Signed)
Follow-Up Visit   Subjective  SAHITH Padilla is a 50 y.o. male who presents for the following: Follow-up (Patient presents for 6 month follow-up UBSE. No new spots noticed. He has a history of BCCs. BCC of the right medial clavicle bx and [facility] 03/25/20.).   The following portions of the chart were reviewed this encounter and updated as appropriate:      Review of Systems:  No other skin or systemic complaints except as noted in HPI or Assessment and Plan.  Objective  Well appearing patient in no apparent distress; mood and affect are within normal limits.  All skin waist up examined.  R medial clavicle, see history Well healed scar with no evidence of recurrence.   R nasal tip 2.22mm firm pink/flesh papule  Right Chest 1.0cm light pink slightly waxy papule          Assessment & Plan  Skin cancer screening performed today. Actinic Damage - chronic, secondary to cumulative UV radiation exposure/sun exposure over time - diffuse scaly erythematous macules with underlying dyspigmentation - Recommend daily broad spectrum sunscreen SPF 30+ to sun-exposed areas, reapply every 2 hours as needed.  - Recommend staying in the shade or wearing long sleeves, sun glasses (UVA+UVB protection) and wide brim hats (4-inch brim around the entire circumference of the hat). - Call for new or changing lesions.  Lentigines - Scattered tan macules - Due to sun exposure - Benign-appering, observe - Recommend daily broad spectrum sunscreen SPF 30+ to sun-exposed areas, reapply every 2 hours as needed. - Call for any changes  Seborrheic Keratoses - Stuck-on, waxy, tan-brown papules and/or plaques  - Benign-appearing - Discussed benign etiology and prognosis. - Observe - Call for any changes   Melanocytic Nevi - Tan-brown and/or pink-flesh-colored symmetric macules and papules, including right upper eyelid - Benign appearing on exam today - Observation - Call clinic for new or changing  moles - Recommend daily use of broad spectrum spf 30+ sunscreen to sun-exposed areas.    Hemangiomas - Red papules, including clustered red papules of the left mid back (vs vascular birthmark) - Discussed benign nature - Observe - Call for any changes  History of basal cell carcinoma (BCC) R medial clavicle, see history  Clear. Observe for recurrence. Call clinic for new or changing lesions.  Recommend regular skin exams, daily broad-spectrum spf 30+ sunscreen use, and photoprotection.    Fibrous papule of nose R nasal tip  Stable. Benign, Observe.   Neoplasm of uncertain behavior of skin Right Chest  Skin / nail biopsy Type of biopsy: tangential   Informed consent: discussed and consent obtained   Patient was prepped and draped in usual sterile fashion: Area prepped with alcohol. Anesthesia: the lesion was anesthetized in a standard fashion   Anesthetic:  1% lidocaine w/ epinephrine 1-100,000 buffered w/ 8.4% NaHCO3 Instrument used: flexible razor blade   Hemostasis achieved with: pressure, aluminum chloride and electrodesiccation   Outcome: patient tolerated procedure well   Post-procedure details: wound care instructions given   Post-procedure details comment:  Ointment and small bandage applied  Specimen 1 - Surgical pathology Differential Diagnosis: Inflamed SK r/o BCC Check Margins: No 1.0cm light pink slightly waxy papule  Recommend EDC if BCC  Return in about 6 months (around 03/29/2021) for UBSE, sooner pending bx.  IJamesetta Padilla, CMA, am acting as scribe for Samuel Patty, MD .  Documentation: I have reviewed the above documentation for accuracy and completeness, and I agree with the above.  Samuel Patty MD

## 2020-10-02 ENCOUNTER — Telehealth: Payer: Self-pay

## 2020-10-02 NOTE — Telephone Encounter (Signed)
-----   Message from Brendolyn Patty, MD sent at 10/02/2020  1:49 PM EDT ----- Skin , right chest SUPERFICIAL BASAL CELL CARCINOMA, PINKUS TYPE (FIBROEPITHELIAL TUMOR OF PINKUS)  BCC skin cancer, superficial- needs EDC  - please call patient

## 2020-10-02 NOTE — Telephone Encounter (Signed)
Advised pt of bx results and scheduled pt for EDC./sh 

## 2020-10-22 ENCOUNTER — Ambulatory Visit (INDEPENDENT_AMBULATORY_CARE_PROVIDER_SITE_OTHER): Payer: 59 | Admitting: Dermatology

## 2020-10-22 ENCOUNTER — Other Ambulatory Visit: Payer: Self-pay

## 2020-10-22 ENCOUNTER — Encounter: Payer: Self-pay | Admitting: Dermatology

## 2020-10-22 DIAGNOSIS — L578 Other skin changes due to chronic exposure to nonionizing radiation: Secondary | ICD-10-CM

## 2020-10-22 DIAGNOSIS — C44519 Basal cell carcinoma of skin of other part of trunk: Secondary | ICD-10-CM | POA: Diagnosis not present

## 2020-10-22 DIAGNOSIS — S80811A Abrasion, right lower leg, initial encounter: Secondary | ICD-10-CM

## 2020-10-22 DIAGNOSIS — T148XXA Other injury of unspecified body region, initial encounter: Secondary | ICD-10-CM

## 2020-10-22 MED ORDER — MUPIROCIN 2 % EX OINT: 1.0000 "application " | TOPICAL_OINTMENT | Freq: Every day | CUTANEOUS | 1 refills | Status: DC

## 2020-10-22 NOTE — Patient Instructions (Signed)

## 2020-10-22 NOTE — Progress Notes (Signed)
   Follow-Up Visit   Subjective  Samuel Padilla is a 50 y.o. male who presents for the following: Skin Cancer (Here for Tx of BCC at right chest. Bx: 09/29/2020. ).  Also has healing abrasion on leg he would like evaluated.  h/o recent trauma, leg went through floor    The following portions of the chart were reviewed this encounter and updated as appropriate:      Review of Systems: No other skin or systemic complaints except as noted in HPI or Assessment and Plan.   Objective  Well appearing patient in no apparent distress; mood and affect are within normal limits.  All skin waist up examined.  right pretibia Large linear erythematous abrasion with hemorrhagic crusting,   right chest Pink bx site  Assessment & Plan  Abrasion right pretibia    Wash daily with soap and water. Then apply Mupirocin oint QD and cover with bandage until healed.  mupirocin ointment (BACTROBAN) 2 % - right pretibia Apply 1 application topically daily. With bandage change  Basal cell carcinoma (BCC) of skin of other part of torso right chest  Destruction of lesion  Destruction method: electrodesiccation and curettage   Timeout:  patient name, date of birth, surgical site, and procedure verified Anesthesia: the lesion was anesthetized in a standard fashion   Anesthetic:  1% lidocaine w/ epinephrine 1-100,000 buffered w/ 8.4% NaHCO3 Curettage performed in three different directions: Yes   Electrodesiccation performed over the curetted area: Yes   Lesion length (cm):  1 Lesion width (cm):  1 Final wound size (cm):  1.3 Hemostasis achieved with:  pressure, aluminum chloride and electrodesiccation Outcome: patient tolerated procedure well with no complications   Post-procedure details: wound care instructions given   Additional details:  Mupirocin ointment and Bandaid applied    Biopsy proven  Actinic Damage - chronic, secondary to cumulative UV radiation exposure/sun exposure over time -  erythematous macules with underlying dyspigmentation - Recommend daily broad spectrum sunscreen SPF 30+ to sun-exposed areas, reapply every 2 hours as needed.  - Recommend staying in the shade or wearing long sleeves, sun glasses (UVA+UVB protection) and wide brim hats (4-inch brim around the entire circumference of the hat). - Call for new or changing lesions.   Return for [facility] recheck and UBSE as scheduled.  I, Emelia Salisbury, CMA, am acting as scribe for Brendolyn Patty, MD.  Documentation: I have reviewed the above documentation for accuracy and completeness, and I agree with the above.  Brendolyn Patty MD

## 2020-12-12 ENCOUNTER — Other Ambulatory Visit: Payer: Self-pay | Admitting: Nurse Practitioner

## 2021-01-14 ENCOUNTER — Other Ambulatory Visit: Payer: Self-pay

## 2021-01-14 ENCOUNTER — Ambulatory Visit: Payer: Self-pay

## 2021-01-14 DIAGNOSIS — Z0000 Encounter for general adult medical examination without abnormal findings: Secondary | ICD-10-CM

## 2021-01-14 LAB — POCT URINALYSIS DIPSTICK
Bilirubin, UA: NEGATIVE
Glucose, UA: NEGATIVE
Ketones, UA: NEGATIVE
Leukocytes, UA: NEGATIVE
Nitrite, UA: NEGATIVE
Protein, UA: POSITIVE — AB
Spec Grav, UA: 1.025 (ref 1.010–1.025)
Urobilinogen, UA: 0.2 E.U./dL
pH, UA: 6 (ref 5.0–8.0)

## 2021-01-14 NOTE — Progress Notes (Signed)
01/21/21 annual physical scheduled.  Reviewed CDC recommendations for importance of HIV/ Hep C screening once in lifetime. Patient has declined HIV / Hep C screenings today and will let us know if they should change their mind in the future.

## 2021-01-15 LAB — CMP12+LP+TP+TSH+6AC+PSA+CBC…
ALT: 17 IU/L (ref 0–44)
AST: 16 IU/L (ref 0–40)
Albumin/Globulin Ratio: 2 (ref 1.2–2.2)
Albumin: 4.3 g/dL (ref 4.0–5.0)
Alkaline Phosphatase: 100 IU/L (ref 44–121)
BUN/Creatinine Ratio: 11 (ref 9–20)
BUN: 11 mg/dL (ref 6–24)
Basophils Absolute: 0.1 10*3/uL (ref 0.0–0.2)
Basos: 1 %
Bilirubin Total: 0.6 mg/dL (ref 0.0–1.2)
Calcium: 9.1 mg/dL (ref 8.7–10.2)
Chloride: 105 mmol/L (ref 96–106)
Chol/HDL Ratio: 3.9 ratio (ref 0.0–5.0)
Cholesterol, Total: 136 mg/dL (ref 100–199)
Creatinine, Ser: 0.99 mg/dL (ref 0.76–1.27)
EOS (ABSOLUTE): 0.4 10*3/uL (ref 0.0–0.4)
Eos: 4 %
Estimated CHD Risk: 0.7 times avg. (ref 0.0–1.0)
Free Thyroxine Index: 2 (ref 1.2–4.9)
GGT: 23 IU/L (ref 0–65)
Globulin, Total: 2.1 g/dL (ref 1.5–4.5)
Glucose: 97 mg/dL (ref 70–99)
HDL: 35 mg/dL — ABNORMAL LOW (ref >39)
Hematocrit: 51.5 % — ABNORMAL HIGH (ref 37.5–51.0)
Hemoglobin: 17.2 g/dL (ref 13.0–17.7)
Immature Grans (Abs): 0 10*3/uL (ref 0.0–0.1)
Immature Granulocytes: 0 %
Iron: 82 ug/dL (ref 38–169)
LDH: 152 IU/L (ref 121–224)
LDL Chol Calc (NIH): 77 mg/dL (ref 0–99)
Lymphocytes Absolute: 2.2 10*3/uL (ref 0.7–3.1)
Lymphs: 21 %
MCH: 31.2 pg (ref 26.6–33.0)
MCHC: 33.4 g/dL (ref 31.5–35.7)
MCV: 93 fL (ref 79–97)
Monocytes Absolute: 0.7 10*3/uL (ref 0.1–0.9)
Monocytes: 7 %
Neutrophils Absolute: 6.8 10*3/uL (ref 1.4–7.0)
Neutrophils: 67 %
Phosphorus: 3.1 mg/dL (ref 2.8–4.1)
Platelets: 246 10*3/uL (ref 150–450)
Potassium: 4.4 mmol/L (ref 3.5–5.2)
Prostate Specific Ag, Serum: 1.1 ng/mL (ref 0.0–4.0)
RBC: 5.52 x10E6/uL (ref 4.14–5.80)
RDW: 12.2 % (ref 11.6–15.4)
Sodium: 144 mmol/L (ref 134–144)
T3 Uptake Ratio: 21 % — ABNORMAL LOW (ref 24–39)
T4, Total: 9.5 ug/dL (ref 4.5–12.0)
TSH: 1.86 u[IU]/mL (ref 0.450–4.500)
Total Protein: 6.4 g/dL (ref 6.0–8.5)
Triglycerides: 138 mg/dL (ref 0–149)
Uric Acid: 6.4 mg/dL (ref 3.8–8.4)
VLDL Cholesterol Cal: 24 mg/dL (ref 5–40)
WBC: 10.2 10*3/uL (ref 3.4–10.8)
eGFR: 93 mL/min/{1.73_m2} (ref >59)

## 2021-01-21 ENCOUNTER — Encounter: Payer: Self-pay | Admitting: Physician Assistant

## 2021-01-21 ENCOUNTER — Other Ambulatory Visit: Payer: Self-pay

## 2021-01-21 ENCOUNTER — Ambulatory Visit: Payer: Self-pay | Admitting: Physician Assistant

## 2021-01-21 ENCOUNTER — Ambulatory Visit
Admission: RE | Admit: 2021-01-21 | Discharge: 2021-01-21 | Disposition: A | Discharge status: Home / Self Care | Payer: 59 | Source: Ambulatory Visit | Attending: Physician Assistant | Admitting: Physician Assistant

## 2021-01-21 ENCOUNTER — Ambulatory Visit
Admission: RE | Admit: 2021-01-21 | Discharge: 2021-01-21 | Disposition: A | Discharge status: Home / Self Care | Payer: 59 | Attending: Physician Assistant | Admitting: Physician Assistant

## 2021-01-21 VITALS — BP 121/73 | HR 76 | Temp 97.8°F | Resp 12 | Ht 72.0 in | Wt 232.0 lb

## 2021-01-21 DIAGNOSIS — M25561 Pain in right knee: Secondary | ICD-10-CM | POA: Diagnosis present

## 2021-01-21 DIAGNOSIS — G8929 Other chronic pain: Secondary | ICD-10-CM | POA: Diagnosis present

## 2021-01-21 DIAGNOSIS — M25562 Pain in left knee: Secondary | ICD-10-CM | POA: Diagnosis present

## 2021-01-21 NOTE — Progress Notes (Signed)
[facility]    ____________________________________________   None    (approximate)  I have reviewed the triage vital signs and the nursing notes.   HISTORY  Chief Complaint Annual Exam    HPI Samuel Padilla is a 51 y.o. male patient presents for annual physical exam.  Patient also concerns of bilateral hand and knee pain.  Duration complaints greater than 6 months.  Patient states pain increased in the knees with intervention for his physical as part of his job.  Patient states hand pain increases with a.m. awakening and decreases throughout the day.  Denies loss of sensation.  No loss of function.  Rates his pain as a 4/10.  Described pain as "achy".  No palliative measure for complaint.        Past Medical History:  Diagnosis Date   Allergy    Anxiety    Basal cell carcinoma 06/04/2009   Right post. arm   Basal cell carcinoma 11/23/2013   right lat. chest. Superficial BCC, Pinkus type.   Basal cell carcinoma 11/23/2013   left med. post. shoulder. Nodular   Basal cell carcinoma 11/23/2013   right spinal upper back. Superficial   Basal cell carcinoma 11/23/2013   left spinal upper back. Superficial.   Basal cell carcinoma 02/17/2016   spinal mid upper back. Superficial   Basal cell carcinoma 08/24/2016   right upper back. Superficial   Basal cell carcinoma 03/13/2018   medial lower chest. Superficial, nodular   Basal cell carcinoma 03/26/2019   L spinal upper back - ED&C    Basal cell carcinoma 03/26/2019   Post neck - ED&C    Basal cell carcinoma 03/25/2020   R medial clavicle, EDC   BCC (basal cell carcinoma) 09/29/2020   R chest, EDC 10/22/2020   CAD (coronary artery disease)    Colon polyps    Depression    Dysplastic nevus 06/04/2009   Left post. shoulder. Moderate atypia.   Elevated lipids    Family history of colon cancer    GERD (gastroesophageal reflux disease)    Hodgkin's lymphoma ([place])    Hypothyroidism    Overweight     Patient  Active Problem List   Diagnosis Date Noted   Patellofemoral dysfunction of right knee 09/07/2018   Anserine bursitis 09/07/2018   Hypothyroidism 01/09/2018   Excessive daytime sleepiness 12/02/2016   At risk for cardiac dysfunction 07/13/2016   Benign essential HTN 08/07/2015   Mixed hyperlipidemia 08/07/2015   Shortness of breath 08/07/2015    Past Surgical History:  Procedure Laterality Date   COLONOSCOPY  06/05/2014   repeat 2019   ESOPHAGOGASTRODUODENOSCOPY  06/05/2014   TUNNELED [place]    Prior to Admission medications   Medication Sig Start Date End Date Taking? Authorizing Provider  aspirin EC 81 MG tablet Take 81 mg by mouth in the morning and at bedtime. Swallow whole.   Yes [provider]  atorvastatin (LIPITOR) 80 MG tablet atorvastatin 80 mg tablet   Yes [provider]  clotrimazole-betamethasone (LOTRISONE) cream Apply to affected area 2 times daily 08/20/20  Yes Sable Feil, PA-C  famotidine (PEPCID) 20 MG tablet Take 20 mg by mouth daily.   Yes [provider]  FLUoxetine (PROZAC) 20 MG capsule Take 1 capsule (20 mg total) by mouth daily. 09/09/20  Yes Sable Feil, PA-C  levothyroxine (SYNTHROID) 100 MCG tablet Take 1 tablet (100 mcg total) by mouth daily before breakfast. 09/09/20  Yes Tamala Julian,  Hinda Lenis, PA-C  metoprolol succinate (TOPROL-XL) 25 MG 24 hr tablet Take by mouth. 10/03/17 01/21/21 Yes [provider]    Allergies Cheese, Erythromycin, and Lac bovis  Family History  Problem Relation Age of Onset   Diverticulitis Mother    Colon cancer Father    Melanoma Father    COPD Father     Social History Social History   Tobacco Use   Smoking status: Never   Smokeless tobacco: Never  Substance Use Topics   Alcohol use: No    Review of Systems Constitutional: No fever/chills Eyes: No visual changes. ENT: No sore throat. Cardiovascular: Denies chest pain. Respiratory: Denies shortness  of breath. Gastrointestinal: No abdominal pain.  GERD.  No nausea, no vomiting.  No diarrhea.  No constipation. Genitourinary: Negative for dysuria. Musculoskeletal: Negative for back pain.  Bilateral hand and knee pain. Skin: Negative for rash. Neurological: Negative for headaches, focal weakness or numbness. Psychiatric: Anxiety depression. Endocrine: Hypertension hypothyroidism. Hematological/Lymphatic:  Allergic/Immunilogical: Cheese, erythromycin, and  Lac bovis.____________________________________________   PHYSICAL EXAM: VITAL SIGNS: Temperature 97.8, pulse 76, respiration 12, BP is 121/73, and patient 96% O2 sat on room air.  Patient weighs 232 pounds. Constitutional: Alert and oriented. Well appearing and in no acute distress. Eyes: Conjunctivae are normal. PERRL. EOMI. Head: Atraumatic. Nose: No congestion/rhinnorhea. Mouth/Throat: Mucous membranes are moist.  Oropharynx non-erythematous. Neck: No stridor.  No cervical spine tenderness to palpation. Hematological/Lymphatic/Immunilogical: No cervical lymphadenopathy. Cardiovascular: Normal rate, regular rhythm. Grossly normal heart sounds.  Good peripheral circulation. Respiratory: Normal respiratory effort.  No retractions. Lungs CTAB. Gastrointestinal: Soft and nontender.  Mild distention secondary to body habitus. No abdominal bruits. No CVA tenderness. Genitourinary: Deferred Musculoskeletal: No lower extremity tenderness nor edema.  No joint effusions. Neurologic:  Normal speech and language. No gross focal neurologic deficits are appreciated. No gait instability. Skin:  Skin is warm, dry and intact. No rash noted. Psychiatric: Mood and affect are normal. Speech and behavior are normal.  ____________________________________________   LABS           Component Ref Range & Units 7 d ago (01/14/21) 7 mo ago (06/25/20) 7 mo ago (06/25/20) 11 mo ago (01/28/20) 2 yr ago (12/27/18) 3 yr ago (10/20/17) 3 yr ago (04/13/17)   Glucose 70 - 99 mg/dL 97    93 R  93 R     Uric Acid 3.8 - 8.4 mg/dL 6.4    6.6 CM  6.4 CM     Comment:            Therapeutic target for gout patients: <6.0  BUN 6 - 24 mg/dL 11    12  10      Creatinine, Ser 0.76 - 1.27 mg/dL 0.99    1.02  1.12     eGFR >59 mL/min/1.73 93         BUN/Creatinine Ratio 9 - 20 11    12  9      Sodium 134 - 144 mmol/L 144    142  143     Potassium 3.5 - 5.2 mmol/L 4.4    4.4  4.3     Chloride 96 - 106 mmol/L 105    104  105     Calcium 8.7 - 10.2 mg/dL 9.1    8.8  8.6 Low      Phosphorus 2.8 - 4.1 mg/dL 3.1    2.6 Low   2.8     Total Protein 6.0 - 8.5 g/dL 6.4    6.3  6.6     Albumin 4.0 - 5.0 g/dL 4.3    4.2  4.3     Globulin, Total 1.5 - 4.5 g/dL 2.1    2.1  2.3     Albumin/Globulin Ratio 1.2 - 2.2 2.0    2.0  1.9     Bilirubin Total 0.0 - 1.2 mg/dL 0.6    0.8  0.7     Alkaline Phosphatase 44 - 121 IU/L 100    105  99 R     LDH 121 - 224 IU/L 152    161  193     AST 0 - 40 IU/L 16    15  19      ALT 0 - 44 IU/L 17    17  17      GGT 0 - 65 IU/L 23    29  27      Iron 38 - 169 ug/dL 82    75  90     Cholesterol, Total 100 - 199 mg/dL 136    134  155     Triglycerides 0 - 149 mg/dL 138    105  168 High      HDL >39 mg/dL 35 Low     32 Low   32 Low      VLDL Cholesterol Cal 5 - 40 mg/dL 24    20  29      LDL Chol Calc (NIH) 0 - 99 mg/dL 77    82  94     Chol/HDL Ratio 0.0 - 5.0 ratio 3.9    4.2 CM  4.8 CM     Comment:                                   T. Chol/HDL Ratio                                              Men  Women                                1/2 Avg.Risk  3.4    3.3                                    Avg.Risk  5.0    4.4                                 2X Avg.Risk  9.6    7.1                                 3X Avg.Risk 23.4   11.0   Estimated CHD Risk 0.0 - 1.0 times avg. 0.7    0.8 CM  1.0 CM     Comment: The CHD Risk is based on the T. Chol/HDL ratio. Other  factors affect CHD Risk such as hypertension, smoking,  diabetes, severe  obesity, and family history of  premature CHD.   TSH 0.450 - 4.500 uIU/mL 1.860    1.370  2.650  2.49 R  2.130  R   T4, Total 4.5 - 12.0 ug/dL 9.5    10.3  9.2   10.9 R   T3 Uptake Ratio 24 - 39 % 21 Low     25  22 Low      Free Thyroxine Index 1.2 - 4.9 2.0    2.6  2.0   2.7 R   Prostate Specific Ag, Serum 0.0 - 4.0 ng/mL 1.1  1.0 CM   0.9 CM  1.1 CM     Comment: Roche ECLIA methodology.  According to the American Urological Association, Serum PSA should  decrease and remain at undetectable levels after radical  prostatectomy. The AUA defines biochemical recurrence as an initial  PSA value 0.2 ng/mL or greater followed by a subsequent confirmatory  PSA value 0.2 ng/mL or greater.  Values obtained with different assay methods or kits cannot be used  interchangeably. Results cannot be interpreted as absolute evidence  of the presence or absence of malignant disease.   WBC 3.4 - 10.8 x10E3/uL 10.2    8.5  9.5     RBC 4.14 - 5.80 x10E6/uL 5.52   0-2 R  5.35  5.33     Hemoglobin 13.0 - 17.7 g/dL 17.2    16.9  17.0     Hematocrit 37.5 - 51.0 % 51.5 High     50.5  50.1     MCV 79 - 97 fL 93    94  94     MCH 26.6 - 33.0 pg 31.2    31.6  31.9     MCHC 31.5 - 35.7 g/dL 33.4    33.5  33.9     RDW 11.6 - 15.4 % 12.2    12.4  12.6     Platelets 150 - 450 x10E3/uL 246    240  236     Neutrophils Not Estab. % 67    69  68     Lymphs Not Estab. % 21    18  21      Monocytes Not Estab. % 7    8  7      Eos Not Estab. % 4    3  3      Basos Not Estab. % 1    1  1      Neutrophils Absolute 1.4 - 7.0 x10E3/uL 6.8    5.9  6.5     Lymphocytes Absolute 0.7 - 3.1 x10E3/uL 2.2    1.6  2.0     Monocytes Absolute 0.1 - 0.9 x10E3/uL 0.7    0.7  0.7     EOS (ABSOLUTE) 0.0 - 0.4 x10E3/uL 0.4    0.2  0.3     Basophils Absolute 0.0 - 0.2 x10E3/uL 0.1    0.0  0.1     Immature Granulocytes Not Estab. % 0    1  0     Immature Grans (Abs) 0.0 - 0.1 x10E3/uL 0.0    0.0  0.0     Resulting Agency  [facility]  [facility]        Narrative Performed byMaryan Puls Performed at:  [facility]  [address], [place], [place]  [id number]  Lab Director: Rush Farmer MD, Phone:  [phone]    Specimen Collected: 01/14/21 15:36 Last Resulted: 01/15/21 08:14      Lab Flowsheet    Order Details    View Encounter    Lab and Collection Details    Routing    Result  History    Brewing technologist      CM=Additional comments  R=Reference range differs from displayed range      Result Care Coordination   Patient Communication   Add Comments   Seen Back to Top       Other Results from 01/14/2021   Contains abnormal data POCT urinalysis dipstick Order: [id number] Status: Final result    Visible to patient: Yes (seen)    Next appt: 04/27/2021 at 08:45 AM in Dermatology Brendolyn Patty, MD)    Dx: Routine adult health maintenance    0 Result Notes        Component Ref Range & Units 7 d ago 7 mo ago 11 mo ago 2 yr ago  Color, UA  dark yellow  Yellow R  dark yellow  Yellow   Clarity, UA  clear   Clear  Clear   Glucose, UA Negative Negative   Negative  Negative   Bilirubin, UA  negative  Negative R  Negative  Negative   Ketones, UA  negative  Negative R  Negative  Negative   Spec Grav, UA 1.010 - 1.025 1.025  1.020 R  >=1.030 Abnormal   1.015   Blood, UA  trace  Trace Abnormal  R  Positive CM  Negative   Comment: -+  pH, UA 5.0 - 8.0 6.0  8.5 High  R  6.0  6.0   Protein, UA Negative Positive Abnormal   Negative R  Negative  Negative   Comment: trace -+ (scant urine)  Urobilinogen, UA 0.2 or 1.0 E.U./dL 0.2   0.2  0.2   Nitrite, UA  negative  Negative R  Negative  Negative   Leukocytes, UA Negative Negative  Negative  Negative  Negative   Appearance  dark                  ____________________________________________  EKG            Component Ref Range & Units 7 d ago (01/14/21) 7 mo ago (06/25/20) 7 mo ago (06/25/20) 11 mo ago (01/28/20) 2 yr  ago (12/27/18) 3 yr ago (10/20/17) 3 yr ago (04/13/17)  Glucose 70 - 99 mg/dL 97    93 R  93 R     Uric Acid 3.8 - 8.4 mg/dL 6.4    6.6 CM  6.4 CM     Comment:            Therapeutic target for gout patients: <6.0  BUN 6 - 24 mg/dL 11    12  10      Creatinine, Ser 0.76 - 1.27 mg/dL 0.99    1.02  1.12     eGFR >59 mL/min/1.73 93         BUN/Creatinine Ratio 9 - 20 11    12  9      Sodium 134 - 144 mmol/L 144    142  143     Potassium 3.5 - 5.2 mmol/L 4.4    4.4  4.3     Chloride 96 - 106 mmol/L 105    104  105     Calcium 8.7 - 10.2 mg/dL 9.1    8.8  8.6 Low      Phosphorus 2.8 - 4.1 mg/dL 3.1    2.6 Low   2.8     Total Protein 6.0 - 8.5 g/dL 6.4    6.3  6.6     Albumin 4.0 - 5.0 g/dL 4.3  4.2  4.3     Globulin, Total 1.5 - 4.5 g/dL 2.1    2.1  2.3     Albumin/Globulin Ratio 1.2 - 2.2 2.0    2.0  1.9     Bilirubin Total 0.0 - 1.2 mg/dL 0.6    0.8  0.7     Alkaline Phosphatase 44 - 121 IU/L 100    105  99 R     LDH 121 - 224 IU/L 152    161  193     AST 0 - 40 IU/L 16    15  19      ALT 0 - 44 IU/L 17    17  17      GGT 0 - 65 IU/L 23    29  27      Iron 38 - 169 ug/dL 82    75  90     Cholesterol, Total 100 - 199 mg/dL 136    134  155     Triglycerides 0 - 149 mg/dL 138    105  168 High      HDL >39 mg/dL 35 Low     32 Low   32 Low      VLDL Cholesterol Cal 5 - 40 mg/dL 24    20  29      LDL Chol Calc (NIH) 0 - 99 mg/dL 77    82  94     Chol/HDL Ratio 0.0 - 5.0 ratio 3.9    4.2 CM  4.8 CM     Comment:                                   T. Chol/HDL Ratio                                              Men  Women                                1/2 Avg.Risk  3.4    3.3                                    Avg.Risk  5.0    4.4                                 2X Avg.Risk  9.6    7.1                                 3X Avg.Risk 23.4   11.0   Estimated CHD Risk 0.0 - 1.0 times avg. 0.7    0.8 CM  1.0 CM     Comment: The CHD Risk is based on the T. Chol/HDL ratio. Other  factors affect CHD Risk  such as hypertension, smoking,  diabetes, severe obesity, and family history of  premature CHD.   TSH 0.450 - 4.500 uIU/mL 1.860    1.370  2.650  2.49 R  2.130 R   T4, Total 4.5 - 12.0 ug/dL 9.5    10.3  9.2   10.9 R   T3 Uptake Ratio 24 - 39 % 21 Low     25  22 Low      Free Thyroxine Index 1.2 - 4.9 2.0    2.6  2.0   2.7 R   Prostate Specific Ag, Serum 0.0 - 4.0 ng/mL 1.1  1.0 CM   0.9 CM  1.1 CM     Comment: Roche ECLIA methodology.  According to the American Urological Association, Serum PSA should  decrease and remain at undetectable levels after radical  prostatectomy. The AUA defines biochemical recurrence as an initial  PSA value 0.2 ng/mL or greater followed by a subsequent confirmatory  PSA value 0.2 ng/mL or greater.  Values obtained with different assay methods or kits cannot be used  interchangeably. Results cannot be interpreted as absolute evidence  of the presence or absence of malignant disease.   WBC 3.4 - 10.8 x10E3/uL 10.2    8.5  9.5     RBC 4.14 - 5.80 x10E6/uL 5.52   0-2 R  5.35  5.33     Hemoglobin 13.0 - 17.7 g/dL 17.2    16.9  17.0     Hematocrit 37.5 - 51.0 % 51.5 High     50.5  50.1     MCV 79 - 97 fL 93    94  94     MCH 26.6 - 33.0 pg 31.2    31.6  31.9     MCHC 31.5 - 35.7 g/dL 33.4    33.5  33.9     RDW 11.6 - 15.4 % 12.2    12.4  12.6     Platelets 150 - 450 x10E3/uL 246    240  236     Neutrophils Not Estab. % 67    69  68     Lymphs Not Estab. % 21    18  21      Monocytes Not Estab. % 7    8  7      Eos Not Estab. % 4    3  3      Basos Not Estab. % 1    1  1      Neutrophils Absolute 1.4 - 7.0 x10E3/uL 6.8    5.9  6.5     Lymphocytes Absolute 0.7 - 3.1 x10E3/uL 2.2    1.6  2.0     Monocytes Absolute 0.1 - 0.9 x10E3/uL 0.7    0.7  0.7     EOS (ABSOLUTE) 0.0 - 0.4 x10E3/uL 0.4    0.2  0.3     Basophils Absolute 0.0 - 0.2 x10E3/uL 0.1    0.0  0.1     Immature Granulocytes Not Estab. % 0    1  0     Immature Grans (Abs) 0.0 - 0.1 x10E3/uL 0.0    0.0   0.0     Resulting Agency  [facility]        Narrative Performed byMaryan Puls Performed at:  [facility]  [address] [place], [place]  [id number]  Lab Director: Rush Farmer MD, Phone:  [phone]    Specimen Collected: 01/14/21 15:36 Last Resulted: 01/15/21 08:14      Lab Flowsheet    Order Details    View Encounter    Lab and Collection Details    Routing    Result History    View Encounter Conversation      CM=Additional comments  R=Reference range differs from displayed range      Result Care Coordination   Patient Communication   Add Comments   Seen Back to Top       Other Results from 01/14/2021   Contains abnormal data POCT urinalysis dipstick Order: [id number] Status: Final result    Visible to patient: Yes (seen)    Next appt: 04/27/2021 at 08:45 AM in Dermatology Brendolyn Patty, MD)    Dx: Routine adult health maintenance    0 Result Notes        Component Ref Range & Units 7 d ago 7 mo ago 11 mo ago 2 yr ago  Color, UA  dark yellow  Yellow R  dark yellow  Yellow   Clarity, UA  clear   Clear  Clear   Glucose, UA Negative Negative   Negative  Negative   Bilirubin, UA  negative  Negative R  Negative  Negative   Ketones, UA  negative  Negative R  Negative  Negative   Spec Grav, UA 1.010 - 1.025 1.025  1.020 R  >=1.030 Abnormal   1.015   Blood, UA  trace  Trace Abnormal  R  Positive CM  Negative   Comment: -+  pH, UA 5.0 - 8.0 6.0  8.5 High  R  6.0  6.0   Protein, UA Negative Positive Abnormal   Negative R  Negative  Negative   Comment: trace -+ (scant urine)  Urobilinogen, UA 0.2 or 1.0 E.U./dL 0.2   0.2  0.2   Nitrite, UA  negative  Negative R  Negative  Negative   Leukocytes, UA Negative Negative  Negative  Negative  Negative   Appearance  dark                  ____________________________________________  Sinus  Rhythm  WITHIN NORMAL LIMITS.  Heart rate 69  bpm. ____________________________________________     ____________________________________________   INITIAL IMPRESSION / ASSESSMENT AND PLAN   As part of my medical decision making, I reviewed the following data within the [facility]   Discussed lab results with patient.  Schedule right knee x-ray along with ANA and sed rate.           ____________________________________________   FINAL CLINICAL IMPRESSION(S) / ED DIAGNOSES  Well exam   ED Discharge Orders          Ordered    ANA        01/21/21 0952    Sedimentation rate        01/21/21 0952    DG Knee Complete 4 Views Right        01/21/21 2542             Note:  This document was prepared using Dragon voice recognition software and may include unintentional dictation errors.

## 2021-01-21 NOTE — Progress Notes (Signed)
Pt denies any issues or concerns at this time/CL,RMA

## 2021-01-22 LAB — ANA: Anti Nuclear Antibody (ANA): NEGATIVE

## 2021-01-22 LAB — SEDIMENTATION RATE: Sed Rate: 2 mm/hr (ref 0–30)

## 2021-02-25 ENCOUNTER — Ambulatory Visit: Payer: Self-pay | Admitting: Physician Assistant

## 2021-02-25 ENCOUNTER — Encounter: Payer: Self-pay | Admitting: Physician Assistant

## 2021-02-25 ENCOUNTER — Other Ambulatory Visit: Payer: Self-pay

## 2021-02-25 VITALS — BP 112/70 | HR 80 | Temp 97.3°F | Resp 12 | Wt 233.0 lb

## 2021-02-25 DIAGNOSIS — H10023 Other mucopurulent conjunctivitis, bilateral: Secondary | ICD-10-CM

## 2021-02-25 MED ORDER — NAPHAZOLINE-PHENIRAMINE 0.025-0.3 % OP SOLN: 1.0000 [drp] | Freq: Four times a day (QID) | OPHTHALMIC | 0 refills | Status: DC | PRN

## 2021-02-25 MED ORDER — GENTAMICIN SULFATE 0.3 % OP SOLN
1.0000 [drp] | OPHTHALMIC | 0 refills | Status: DC
Start: 2021-02-25 — End: 2021-10-19

## 2021-02-25 NOTE — Progress Notes (Signed)
Right eye reddened, swollen & itchy since Saturday. Thought it was allergies & used some Muro 28 drops he had on hand. Not getting any better - getting worse. Denies pain or drainage.  AMD

## 2021-02-25 NOTE — Progress Notes (Signed)
° °  Subjective: Eye infection    Patient ID: Samuel Padilla, male    DOB: [birth year], 51 y.o.   MRN: [id number]  HPI Patient presents with bilateral redness, irritation, and drainage from eyes.  Patient stated right greater than left.  Denies vision disturbance.   Review of Systems Hyperlipidemia, hypertension, and hypothyroidism.    Objective:   Physical Exam No acute distress.  Temperature 97.3, pulse 80, respiration 12, BP is 112/70, patient 97% O2 sat on room air.   Bilateral conjunctiva erythema and purulent drainage.  Edematous eyelids.       Assessment & Plan: Conjunctivitis.   Patient given a prescription for gentamicin ophthalmic eyedrops.  Patient also given prescription for Naphcon eyedrops.  Use medication as directed and follow-up in 3 days if no improvement or worsening complaint.

## 2021-03-19 ENCOUNTER — Other Ambulatory Visit: Payer: Self-pay | Admitting: Physician Assistant

## 2021-03-19 DIAGNOSIS — E039 Hypothyroidism, unspecified: Secondary | ICD-10-CM

## 2021-03-20 ENCOUNTER — Other Ambulatory Visit: Payer: Self-pay

## 2021-03-20 NOTE — Telephone Encounter (Signed)
Pended Rx refill request sent electronically by pharmacy to [facility] ED already  in [place]. ? ?Re-routed the pended Rx refill request to  ?Randel Pigg, PA-C. ? ?AMD ?

## 2021-04-14 ENCOUNTER — Ambulatory Visit: Payer: 59 | Admitting: Dermatology

## 2021-04-27 ENCOUNTER — Ambulatory Visit (INDEPENDENT_AMBULATORY_CARE_PROVIDER_SITE_OTHER): Payer: 59 | Admitting: Dermatology

## 2021-04-27 DIAGNOSIS — L570 Actinic keratosis: Secondary | ICD-10-CM

## 2021-04-27 DIAGNOSIS — D2239 Melanocytic nevi of other parts of face: Secondary | ICD-10-CM

## 2021-04-27 DIAGNOSIS — Z86018 Personal history of other benign neoplasm: Secondary | ICD-10-CM

## 2021-04-27 DIAGNOSIS — Z85828 Personal history of other malignant neoplasm of skin: Secondary | ICD-10-CM

## 2021-04-27 DIAGNOSIS — L814 Other melanin hyperpigmentation: Secondary | ICD-10-CM

## 2021-04-27 DIAGNOSIS — Z1283 Encounter for screening for malignant neoplasm of skin: Secondary | ICD-10-CM

## 2021-04-27 DIAGNOSIS — L821 Other seborrheic keratosis: Secondary | ICD-10-CM

## 2021-04-27 DIAGNOSIS — L578 Other skin changes due to chronic exposure to nonionizing radiation: Secondary | ICD-10-CM

## 2021-04-27 DIAGNOSIS — L910 Hypertrophic scar: Secondary | ICD-10-CM | POA: Diagnosis not present

## 2021-04-27 DIAGNOSIS — D1800 Hemangioma unspecified site: Secondary | ICD-10-CM

## 2021-04-27 DIAGNOSIS — D229 Melanocytic nevi, unspecified: Secondary | ICD-10-CM

## 2021-04-27 NOTE — Progress Notes (Signed)
? ?Follow-Up Visit ?  ?Subjective  ?Samuel Padilla is a 51 y.o. male who presents for the following: UBSE (The patient presents for Upper Body Skin Exam (UBSE) for skin cancer screening and mole check.  The patient has spots, moles and lesions to be evaluated, some may be new or changing and the patient has concerns that these could be cancer. Patient does have a hx of BCC and Dysplastic Nevi. ).  H/o radiation to chest for lymphoma years ago. ? ? ?The following portions of the chart were reviewed this encounter and updated as appropriate:  ?  ?  ? ?Review of Systems:  No other skin or systemic complaints except as noted in HPI or Assessment and Plan. ? ?Objective  ?Well appearing patient in no apparent distress; mood and affect are within normal limits. ? ?All skin waist up examined. ? ?right chest ?Clear with pink pink/white papules c/w hypertrophic scar at superior edge ? ?right nasal tip ?2 mm firm flesh papule ? ?left anterior shoulder ?3 mm pink smooth macule ? ? ? ?Assessment & Plan  ?History of basal cell carcinoma (BCC) ?right chest ? ?With hypertrophic scar ? ?Clear. Observe for recurrence. Call clinic for new or changing lesions.  Recommend regular skin exams, daily broad-spectrum spf 30+ sunscreen use, and photoprotection.    ? ?Fibrous papule of nose ?right nasal tip ? ?Stable. Benign, Observe.  ? ?AK (actinic keratosis) ?left anterior shoulder ? ?Vs BCC ? ?Recheck on follow up.  ? ?Destruction of lesion - left anterior shoulder ? ?Destruction method: cryotherapy   ?Informed consent: discussed and consent obtained   ?Lesion destroyed using liquid nitrogen: Yes   ?Region frozen until ice ball extended beyond lesion: Yes   ?Outcome: patient tolerated procedure well with no complications   ?Post-procedure details: wound care instructions given   ?Additional details:  Prior to procedure, discussed risks of blister formation, small wound, skin dyspigmentation, or rare scar following cryotherapy. Recommend  Vaseline ointment to treated areas while healing.  ? ? ?History of Basal Cell Carcinoma of the Skin ?- No evidence of recurrence today ?- Recommend regular full body skin exams ?- Recommend daily broad spectrum sunscreen SPF 30+ to sun-exposed areas, reapply every 2 hours as needed.  ?- Call if any new or changing lesions are noted between office visits ? ?Lentigines ?- Scattered tan macules ?- Due to sun exposure ?- Benign-appearing, observe ?- Recommend daily broad spectrum sunscreen SPF 30+ to sun-exposed areas, reapply every 2 hours as needed. ?- Call for any changes ? ?Seborrheic Keratoses ?- Stuck-on, waxy, tan-brown papules and/or plaques  ?- Benign-appearing ?- Discussed benign etiology and prognosis. ?- Observe ?- Call for any changes ? ?Melanocytic Nevi ?- Tan-brown and/or pink-flesh-colored symmetric macules and papules ?- Benign appearing on exam today ?- Observation ?- Call clinic for new or changing moles ?- Recommend daily use of broad spectrum spf 30+ sunscreen to sun-exposed areas.  ? ?Hemangiomas ?- Red papules ?- Discussed benign nature ?- Observe ?- Call for any changes ? ?Actinic Damage ?- Chronic condition, secondary to cumulative UV/sun exposure ?- diffuse scaly erythematous macules with underlying dyspigmentation ?- Recommend daily broad spectrum sunscreen SPF 30+ to sun-exposed areas, reapply every 2 hours as needed.  ?- Staying in the shade or wearing long sleeves, sun glasses (UVA+UVB protection) and wide brim hats (4-inch brim around the entire circumference of the hat) are also recommended for sun protection.  ?- Call for new or changing lesions. ? ?Skin cancer screening performed  today. ? ?History of Dysplastic Nevi ?- No evidence of recurrence today ?- Recommend regular full body skin exams ?- Recommend daily broad spectrum sunscreen SPF 30+ to sun-exposed areas, reapply every 2 hours as needed.  ?- Call if any new or changing lesions are noted between office visits ? ?Return in about  6 months (around 10/27/2021) for AK follow up at left ant shoulder, UBSE. ? ?Graciella Belton, RMA, am acting as scribe for Brendolyn Patty, MD .Documentation: I have reviewed the above documentation for accuracy and completeness, and I agree with the above. ? ?Brendolyn Patty MD  ? ?

## 2021-04-27 NOTE — Patient Instructions (Addendum)
Cryotherapy Aftercare ? ?Wash gently with soap and water everyday.   ?Apply Vaseline and Band-Aid daily until healed.  ? ? ?Melanoma ABCDEs ? ?Melanoma is the most dangerous type of skin cancer, and is the leading cause of death from skin disease.  You are more likely to develop melanoma if you: ?Have light-colored skin, light-colored eyes, or red or blond hair ?Spend a lot of time in the sun ?Tan regularly, either outdoors or in a tanning bed ?Have had blistering sunburns, especially during childhood ?Have a close family member who has had a melanoma ?Have atypical moles or large birthmarks ? ?Early detection of melanoma is key since treatment is typically straightforward and cure rates are extremely high if we catch it early.  ? ?The first sign of melanoma is often a change in a mole or a new dark spot.  The ABCDE system is a way of remembering the signs of melanoma. ? ?A for asymmetry:  The two halves do not match. ?B for border:  The edges of the growth are irregular. ?C for color:  A mixture of colors are present instead of an even brown color. ?D for diameter:  Melanomas are usually (but not always) greater than 10m - the size of a pencil eraser. ?E for evolution:  The spot keeps changing in size, shape, and color. ? ?Please check your skin once per month between visits. You can use a small mirror in front and a large mirror behind you to keep an eye on the back side or your body.  ? ?If you see any new or changing lesions before your next follow-up, please call to schedule a visit. ? ?Please continue daily skin protection including broad spectrum sunscreen SPF 30+ to sun-exposed areas, reapplying every 2 hours as needed when you're outdoors.   ? ?If You Need Anything After Your Visit ? ?If you have any questions or concerns for your doctor, please call our main line at [phone] press option 4 to reach your doctor's medical assistant. If no one answers, please leave a voicemail as directed and we will  return your call as soon as possible. Messages left after 4 pm will be answered the following business day.  ? ?You may also send [place] message via MyChart. We typically respond to MyChart messages within 1-2 business days. ? ?For prescription refills, please ask your pharmacy to contact our office. Our fax number is [phone] ? ?If you have an urgent issue when the clinic is closed that cannot wait until the next business day, you can page your doctor at the number below.   ? ?Please note that while we do our best to be available for urgent issues outside of office hours, we are not available 24/7.  ? ?If you have an urgent issue and are unable to reach [place] you may choose to seek medical care at your doctor's office, retail clinic, urgent care center, or emergency room. ? ?If you have a medical emergency, please immediately call 911 or go to the emergency department. ? ?Pager Numbers ? ?- Dr. KNehemiah Massed [phone]? ?- Dr. MLaurence Ferrari [phone]? ?- Dr. SNicole Kindred [phone]? ?In the event of inclement weather, please call our main line at [phone] an update on the status of any delays or closures. ? ?Dermatology Medication Tips: ?Please keep the boxes that topical medications come in in order to help keep track of the instructions about where and how to use these. Pharmacies typically print the medication  instructions only on the boxes and not directly on the medication tubes.  ? ?If your medication is too expensive, please contact our office at [phone] option 4 or send [place] a message through [place].  ? ?We are unable to tell what your co-pay for medications will be in advance as this is different depending on your insurance coverage. However, we may be able to find a substitute medication at lower cost or fill out paperwork to get insurance to cover a needed medication.  ? ?If a prior authorization is required to get your medication covered by your insurance company, please allow [place] 1-2 business  days to complete this process. ? ?Drug prices often vary depending on where the prescription is filled and some pharmacies may offer cheaper prices. ? ?The website www.goodrx.com contains coupons for medications through different pharmacies. The prices here do not account for what the cost may be with help from insurance (it may be cheaper with your insurance), but the website can give you the price if you did not use any insurance.  ?- You can print the associated coupon and take it with your prescription to the pharmacy.  ?- You may also stop by our office during regular business hours and pick up a GoodRx coupon card.  ?- If you need your prescription sent electronically to a different pharmacy, notify our office through [facility] or by phone at [phone] option 4. ? ? ? ? ?Si Usted Necesita Algo Despu?s de Su Visita ? ?Tambi?n puede enviarnos un mensaje a trav?s de MyChart. Por lo general respondemos a los mensajes de MyChart en el transcurso de 1 a 2 d?as h?biles. ? ?Para renovar recetas, por favor pida a su farmacia que se ponga en contacto con nuestra oficina. Nuestro n?mero de fax es el [phone]. ? ?Si tiene un asunto urgente cuando la cl?nica est? cerrada y que no puede esperar hasta el siguiente d?a h?bil, puede llamar/localizar a su doctor(a) al n?mero que aparece a continuaci?n.  ? ?Por favor, tenga en cuenta que aunque hacemos todo lo posible para estar disponibles para asuntos urgentes fuera del horario de oficina, no estamos disponibles las 24 horas del d?a, los 7 d?as de la semana.  ? ?Si tiene un problema urgente y no puede comunicarse con nosotros, puede optar por buscar atenci?n m?dica  en el consultorio de su doctor(a), en una cl?nica privada, en un centro de atenci?n urgente o en una sala de emergencias. ? ?Si tiene Engineer, maintenance (IT) m?dica, por favor llame inmediatamente al 911 o vaya a la sala de emergencias. ? ?N?meros de b?per ? ?- Dr. Nehemiah Massed: [phone] ? ?- Dra. Moye:  [phone] ? ?- Dra. Nicole Kindred: [phone] ? ?En caso de inclemencias del tiempo, por favor llame a nuestra l?nea principal al [phone] para una actualizaci?n sobre el estado de cualquier retraso o cierre. ? ?Consejos para la medicaci?n en dermatolog?a: ?Por favor, guarde las cajas en las que vienen los medicamentos de uso t?pico para ayudarle a seguir las instrucciones sobre d?nde y c?mo usarlos. Las farmacias generalmente imprimen las instrucciones del medicamento s?lo en las cajas y no directamente en los tubos del [place].  ? ?Si su medicamento es muy caro, por favor, p?ngase en contacto con Zigmund Daniel llamando al [phone] y presione la opci?n 4 o env?enos un mensaje a trav?s de MyChart.  ? ?No podemos decirle cu?l ser? su copago por los medicamentos por adelantado ya que esto es diferente dependiendo de la cobertura de su seguro. Sin embargo,  es posible que podamos encontrar un medicamento sustituto a Electrical engineer un formulario para que el seguro cubra el medicamento que se considera necesario.  ? ?Si se requiere Ardelia Mems autorizaci?n previa para que su compa??a de seguros [place] su medicamento, por favor perm?tanos de 1 a 2 d?as h?biles para completar este proceso. ? ?Los precios de los medicamentos var?an con frecuencia dependiendo del Environmental consultant de d?nde se surte la receta y alguna farmacias pueden ofrecer precios m?s baratos. ? ?El sitio web www.goodrx.com tiene cupones para medicamentos de Airline pilot. Los precios aqu? no tienen en cuenta lo que podr?a costar con la ayuda del seguro (puede ser m?s barato con su seguro), pero el sitio web puede darle el precio si no utiliz? ning?n seguro.  ?- Puede imprimir el cup?n correspondiente y llevarlo con su receta a la farmacia.  ?- Tambi?n puede pasar por nuestra oficina durante el horario de atenci?n regular y recoger una tarjeta de cupones de GoodRx.  ?- Si necesita que su receta se env?e electr?nicamente a Chiropodist,  informe a nuestra oficina a trav?s de MyChart de [place] o por tel?fono llamando al [phone] y presione la opci?n 4. ? ?

## 2021-04-29 ENCOUNTER — Ambulatory Visit: Payer: Self-pay | Admitting: Physician Assistant

## 2021-04-29 ENCOUNTER — Encounter: Payer: Self-pay | Admitting: Physician Assistant

## 2021-04-29 VITALS — BP 117/72 | HR 79 | Temp 97.7°F | Resp 12 | Ht 72.0 in | Wt 228.0 lb

## 2021-04-29 DIAGNOSIS — M79604 Pain in right leg: Secondary | ICD-10-CM

## 2021-04-29 NOTE — Progress Notes (Deleted)
Pt presents today with painful lump on right shin x 1 week w/o injury. ?

## 2021-04-29 NOTE — Progress Notes (Deleted)
? ?  Subjective:  ? ? Patient ID: Samuel Padilla, male    DOB: [birth year], 51 y.o.   MRN: [id number] ? ?HPI ? ? ? ?Review of Systems ? ?   ?Objective:  ? Physical Exam ? ? ? ? ?   ?Assessment & Plan:  ? ? ?

## 2021-04-29 NOTE — Progress Notes (Signed)
? ?  Subjective: Right leg pain  ? ? Patient ID: Samuel Padilla, male    DOB: June 15, 1970, 51 y.o.   MRN: [id number] ? ?HPI ? ?Patient presents with right leg pain secondary to a nodule lesion center distal third of the leg.  Onset of complaint for 3 days.  Denies dyspnea or shortness of breath.  Positive concerned secondary to history of DVT in his family. ? ?Review of Systems ?Hypertension hypothyroidism. ?   ?Objective:  ? Physical Exam ? ?Temperature 97.7, respiration 12, pulse 79, and BP is 117/72.  Patient is 96% O2 sat on room air.  Patient weighs 228 pounds and BMI is 30.92. ?Examination of the right leg shows at the proximal one third there is a palpable nodule lesion.  Moderate guarding with palpation. ? ? ?   ?Right leg mass.  Further evaluation with an ultrasound nonvascular is warranted. ?

## 2021-05-01 NOTE — Progress Notes (Signed)
Ultrasound scheduled for 05/06/2021 at 3:15 pm with an arrival time of 3:00 pm.   ? ?Location:  [facility] ? ?Lennette Bihari notified of appt date/time & location. ? ?AMD ?

## 2021-05-06 ENCOUNTER — Other Ambulatory Visit: Payer: Self-pay

## 2021-05-06 ENCOUNTER — Ambulatory Visit
Admission: RE | Admit: 2021-05-06 | Discharge: 2021-05-06 | Disposition: A | Discharge status: Home / Self Care | Payer: 59 | Source: Ambulatory Visit | Attending: Physician Assistant | Admitting: Physician Assistant

## 2021-05-06 DIAGNOSIS — M79604 Pain in right leg: Secondary | ICD-10-CM | POA: Diagnosis not present

## 2021-07-09 DIAGNOSIS — M65331 Trigger finger, right middle finger: Secondary | ICD-10-CM | POA: Insufficient documentation

## 2021-10-19 ENCOUNTER — Ambulatory Visit: Payer: Self-pay | Admitting: Physician Assistant

## 2021-10-19 ENCOUNTER — Encounter: Payer: Self-pay | Admitting: Physician Assistant

## 2021-10-19 ENCOUNTER — Ambulatory Visit
Admission: RE | Admit: 2021-10-19 | Discharge: 2021-10-19 | Disposition: A | Discharge status: Home / Self Care | Payer: 59 | Attending: Physician Assistant | Admitting: Physician Assistant

## 2021-10-19 ENCOUNTER — Ambulatory Visit
Admission: RE | Admit: 2021-10-19 | Discharge: 2021-10-19 | Disposition: A | Discharge status: Home / Self Care | Payer: 59 | Source: Ambulatory Visit | Attending: Physician Assistant | Admitting: Physician Assistant

## 2021-10-19 VITALS — BP 114/69 | HR 85 | Temp 98.0°F | Resp 14 | Ht 72.0 in | Wt 235.0 lb

## 2021-10-19 DIAGNOSIS — M25511 Pain in right shoulder: Secondary | ICD-10-CM | POA: Diagnosis present

## 2021-10-19 NOTE — Progress Notes (Signed)
Pt presents today for right shoulder pain for about 2 weeks. Over the weekend pain got worse (constant ache). Pt denies any injury.

## 2021-10-19 NOTE — Progress Notes (Signed)
   Subjective: Right shoulder pain    Patient ID: Samuel Padilla, male    DOB: [birth year], 51 y.o.   MRN: [id number]  HPI Patient complain of right shoulder pain for approximately 2 to 3 weeks.  No provocative incident for complaint.  Patient described pain as "achy".  Patient pain increased with abduction and overhead reaching.  Rates pain as a constant 5/10.  Patient is right-hand dominant.  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   Review of Systems Hyperlipidemia, hypertension, and hypothyroidism.    Objective:   Physical Exam No acute distress.  Right-hand-dominant. BP is 114/69, pulse 85, respiration 14, temperature 98, patient 96% O2 sat on room air.  Patient weighs 235 pounds will follow equipment.  Patient has a 20 pound weight gain of all of this field equipment. BMI 31.87. No obvious deformity to the right upper extremity.  Patient has moderate guarding with palpation of the [place] joint.  Patient has decreased range of motion limited by complaint of pain with abduction and overhead reaching.  Neurovascular intact.       Assessment & Plan: Right shoulder pain  Differentials consist of arthritis, tendinitis, or musculoskeletal pain.  Patient given heating pads applied to area off duty.  Advised to have x-ray of the right shoulder before considering further evaluation and treatment.

## 2021-10-21 ENCOUNTER — Ambulatory Visit: Payer: 59 | Admitting: Physician Assistant

## 2021-11-03 ENCOUNTER — Ambulatory Visit (INDEPENDENT_AMBULATORY_CARE_PROVIDER_SITE_OTHER): Payer: 59 | Admitting: Dermatology

## 2021-11-03 DIAGNOSIS — D2239 Melanocytic nevi of other parts of face: Secondary | ICD-10-CM

## 2021-11-03 DIAGNOSIS — L820 Inflamed seborrheic keratosis: Secondary | ICD-10-CM

## 2021-11-03 DIAGNOSIS — L578 Other skin changes due to chronic exposure to nonionizing radiation: Secondary | ICD-10-CM

## 2021-11-03 DIAGNOSIS — L918 Other hypertrophic disorders of the skin: Secondary | ICD-10-CM

## 2021-11-03 DIAGNOSIS — L821 Other seborrheic keratosis: Secondary | ICD-10-CM

## 2021-11-03 DIAGNOSIS — Z1283 Encounter for screening for malignant neoplasm of skin: Secondary | ICD-10-CM | POA: Diagnosis not present

## 2021-11-03 DIAGNOSIS — D485 Neoplasm of uncertain behavior of skin: Secondary | ICD-10-CM | POA: Diagnosis not present

## 2021-11-03 DIAGNOSIS — Z85828 Personal history of other malignant neoplasm of skin: Secondary | ICD-10-CM | POA: Diagnosis not present

## 2021-11-03 DIAGNOSIS — D1801 Hemangioma of skin and subcutaneous tissue: Secondary | ICD-10-CM

## 2021-11-03 DIAGNOSIS — Z872 Personal history of diseases of the skin and subcutaneous tissue: Secondary | ICD-10-CM

## 2021-11-03 NOTE — Progress Notes (Signed)
Follow-Up Visit   Subjective  Samuel Padilla is a 51 y.o. male who presents for the following: UBSE (Hx of BCC, dysplastic nevus. Patient does have a spot at left shoulder that gets irritated with uniform. ) and Follow-up (Recheck AK at left anterior shoulder. ).  The patient presents for Upper Body Skin Exam (UBSE) for skin cancer screening and mole check.  The patient has spots, moles and lesions to be evaluated, some may be new or changing and the patient has concerns that these could be cancer.   The following portions of the chart were reviewed this encounter and updated as appropriate:       Review of Systems:  No other skin or systemic complaints except as noted in HPI or Assessment and Plan.  Objective  Well appearing patient in no apparent distress; mood and affect are within normal limits.  All skin waist up examined.  L medial shoulder x 1 Erythematous stuck-on, waxy papule   right chest 7 x 53m pink firm papule superior edge of scar  right nasal dorsum at tip 3 mm flesh papule    Assessment & Plan  Inflamed seborrheic keratosis L medial shoulder x 1  Symptomatic, irritating, patient would like treated.   Destruction of lesion - L medial shoulder x 1  Destruction method: cryotherapy   Informed consent: discussed and consent obtained   Lesion destroyed using liquid nitrogen: Yes   Region frozen until ice ball extended beyond lesion: Yes   Outcome: patient tolerated procedure well with no complications   Post-procedure details: wound care instructions given   Additional details:  Prior to procedure, discussed risks of blister formation, small wound, skin dyspigmentation, or rare scar following cryotherapy. Recommend Vaseline ointment to treated areas while healing.   Neoplasm of uncertain behavior of skin right chest  Epidermal / dermal shaving  Lesion diameter (cm):  1 Informed consent: discussed and consent obtained   Timeout: patient name, date of  birth, surgical site, and procedure verified   Patient was prepped and draped in usual sterile fashion: area prepped with alcohol. Anesthesia: the lesion was anesthetized in a standard fashion   Anesthetic:  1% lidocaine w/ epinephrine 1-100,000 local infiltration Instrument used: flexible razor blade   Hemostasis achieved with: pressure, aluminum chloride and electrodesiccation   Outcome: patient tolerated procedure well   Post-procedure details: wound care instructions given   Post-procedure details comment:  Ointment and a small bandage applied  Specimen 1 - Surgical pathology Differential Diagnosis: Hypertrophic scar vs recurrent BCC  Check Margins: yes  7 x 368mpink firm papule superior edge of scar  Fibrous papule of nose right nasal dorsum at tip  Benign-appearing.  Observation.  Call clinic for new or changing lesions.  Recommend daily use of broad spectrum spf 30+ sunscreen to sun-exposed areas.    Seborrheic Keratoses - Stuck-on, waxy, tan-brown papules - Benign-appearing - Discussed benign etiology and prognosis. - Observe - Call for any changes  Hemangiomas - Red papules - Discussed benign nature - Observe - Call for any changes  Actinic Damage - chronic, secondary to cumulative UV radiation exposure/sun exposure over time - diffuse scaly erythematous macules with underlying dyspigmentation - Recommend daily broad spectrum sunscreen SPF 30+ to sun-exposed areas, reapply every 2 hours as needed.  - Recommend staying in the shade or wearing long sleeves, sun glasses (UVA+UVB protection) and wide brim hats (4-inch brim around the entire circumference of the hat). - Call for new or changing lesions.  Acrochordons (Skin Tags) - Fleshy, skin-colored pedunculated papules neck - Benign appearing.  - Observe. - If desired, they can be removed with an in office procedure that is not covered by insurance. - Please call the clinic if you notice any new or changing  lesions.  History of Basal Cell Carcinoma of the Skin - No evidence of recurrence today, multiple sites, see history - Recommend regular full body skin exams - Recommend daily broad spectrum sunscreen SPF 30+ to sun-exposed areas, reapply every 2 hours as needed.  - Call if any new or changing lesions are noted between office visits  History of PreCancerous Actinic Keratosis  - site(s) of PreCancerous Actinic Keratosis clear today. L ant shoulder - these may recur and new lesions may form requiring treatment to prevent transformation into skin cancer - observe for new or changing spots and contact [facility] for appointment if occur - photoprotection with sun protective clothing; sunglasses and broad spectrum sunscreen with SPF of at least 30 + and frequent self skin exams recommended - yearly exams by a dermatologist recommended for persons with history of PreCancerous Actinic Keratoses   Return in about 6 months (around 05/04/2022) for UBSE, Hx BCC.  Graciella Belton, RMA, am acting as scribe for Brendolyn Patty, MD .  Documentation: I have reviewed the above documentation for accuracy and completeness, and I agree with the above.  Brendolyn Patty MD

## 2021-11-03 NOTE — Patient Instructions (Addendum)
Wound Care Instructions  Cleanse wound gently with soap and water once a day then pat dry with clean gauze. Apply a thin coat of Petrolatum (petroleum jelly, "Vaseline") over the wound (unless you have an allergy to this). We recommend that you use a new, sterile tube of Vaseline. Do not pick or remove scabs. Do not remove the yellow or white "healing tissue" from the base of the wound.  Cover the wound with fresh, clean, nonstick gauze and secure with paper tape. You may use Band-Aids in place of gauze and tape if the wound is small enough, but would recommend trimming much of the tape off as there is often too much. Sometimes Band-Aids can irritate the skin.  You should call the office for your biopsy report after 1 week if you have not already been contacted.  If you experience any problems, such as abnormal amounts of bleeding, swelling, significant bruising, significant pain, or evidence of infection, please call the office immediately.  FOR ADULT SURGERY PATIENTS: If you need something for pain relief you may take 1 extra strength Tylenol (acetaminophen) AND 2 Ibuprofen (200mg each) together every 4 hours as needed for pain. (do not take these if you are allergic to them or if you have a reason you should not take them.) Typically, you may only need pain medication for 1 to 3 days.     Cryotherapy Aftercare  Wash gently with soap and water everyday.   Apply Vaseline and Band-Aid daily until healed.    Due to recent changes in healthcare laws, you may see results of your pathology and/or laboratory studies on MyChart before the doctors have had a chance to review them. We understand that in some cases there may be results that are confusing or concerning to you. Please understand that not all results are received at the same time and often the doctors may need to interpret multiple results in order to provide you with the best plan of care or course of treatment. Therefore, we ask that you  please give us 2 business days to thoroughly review all your results before contacting the office for clarification. Should we see a critical lab result, you will be contacted sooner.   If You Need Anything After Your Visit  If you have any questions or concerns for your doctor, please call our main line at [phone] and press option 4 to reach your doctor's medical assistant. If no one answers, please leave a voicemail as directed and we will return your call as soon as possible. Messages left after 4 pm will be answered the following business day.   You may also send us a message via MyChart. We typically respond to MyChart messages within 1-2 business days.  For prescription refills, please ask your pharmacy to contact our office. Our fax number is [phone].  If you have an urgent issue when the clinic is closed that cannot wait until the next business day, you can page your doctor at the number below.    Please note that while we do our best to be available for urgent issues outside of office hours, we are not available 24/7.   If you have an urgent issue and are unable to reach us, you may choose to seek medical care at your doctor's office, retail clinic, urgent care center, or emergency room.  If you have a medical emergency, please immediately call 911 or go to the emergency department.  Pager Numbers  - Dr. Kowalski: [phone]  -   Dr. Moye: [phone]  - Dr. Stewart: [phone]  In the event of inclement weather, please call our main line at [phone] for an update on the status of any delays or closures.  Dermatology Medication Tips: Please keep the boxes that topical medications come in in order to help keep track of the instructions about where and how to use these. Pharmacies typically print the medication instructions only on the boxes and not directly on the medication tubes.   If your medication is too expensive, please contact our office at  [phone] option 4 or send us a message through MyChart.   We are unable to tell what your co-pay for medications will be in advance as this is different depending on your insurance coverage. However, we may be able to find a substitute medication at lower cost or fill out paperwork to get insurance to cover a needed medication.   If a prior authorization is required to get your medication covered by your insurance company, please allow us 1-2 business days to complete this process.  Drug prices often vary depending on where the prescription is filled and some pharmacies may offer cheaper prices.  The website www.goodrx.com contains coupons for medications through different pharmacies. The prices here do not account for what the cost may be with help from insurance (it may be cheaper with your insurance), but the website can give you the price if you did not use any insurance.  - You can print the associated coupon and take it with your prescription to the pharmacy.  - You may also stop by our office during regular business hours and pick up a GoodRx coupon card.  - If you need your prescription sent electronically to a different pharmacy, notify our office through [facility] MyChart or by phone at [phone] option 4.     Si Usted Necesita Algo Despus de Su Visita  Tambin puede enviarnos un mensaje a travs de MyChart. Por lo general respondemos a los mensajes de MyChart en el transcurso de 1 a 2 das hbiles.  Para renovar recetas, por favor pida a su farmacia que se ponga en contacto con nuestra oficina. Nuestro nmero de fax es el [phone].  Si tiene un asunto urgente cuando la clnica est cerrada y que no puede esperar hasta el siguiente da hbil, puede llamar/localizar a su doctor(a) al nmero que aparece a continuacin.   Por favor, tenga en cuenta que aunque hacemos todo lo posible para estar disponibles para asuntos urgentes fuera del horario de oficina, no estamos  disponibles las 24 horas del da, los 7 das de la semana.   Si tiene un problema urgente y no puede comunicarse con nosotros, puede optar por buscar atencin mdica  en el consultorio de su doctor(a), en una clnica privada, en un centro de atencin urgente o en una sala de emergencias.  Si tiene una emergencia mdica, por favor llame inmediatamente al 911 o vaya a la sala de emergencias.  Nmeros de bper  - Dr. Kowalski: [phone]  - Dra. Moye: [phone]  - Dra. Stewart: [phone]  En caso de inclemencias del tiempo, por favor llame a nuestra lnea principal al [phone] para una actualizacin sobre el estado de cualquier retraso o cierre.  Consejos para la medicacin en dermatologa: Por favor, guarde las cajas en las que vienen los medicamentos de uso tpico para ayudarle a seguir las instrucciones sobre dnde y cmo usarlos. Las farmacias generalmente imprimen las instrucciones del medicamento slo en las cajas y   no directamente en los tubos del medicamento.   Si su medicamento es muy caro, por favor, pngase en contacto con nuestra oficina llamando al [phone] y presione la opcin 4 o envenos un mensaje a travs de MyChart.   No podemos decirle cul ser su copago por los medicamentos por adelantado ya que esto es diferente dependiendo de la cobertura de su seguro. Sin embargo, es posible que podamos encontrar un medicamento sustituto a menor costo o llenar un formulario para que el seguro cubra el medicamento que se considera necesario.   Si se requiere una autorizacin previa para que su compaa de seguros cubra su medicamento, por favor permtanos de 1 a 2 das hbiles para completar este proceso.  Los precios de los medicamentos varan con frecuencia dependiendo del lugar de dnde se surte la receta y alguna farmacias pueden ofrecer precios ms baratos.  El sitio web www.goodrx.com tiene cupones para medicamentos de diferentes farmacias. Los precios aqu no  tienen en cuenta lo que podra costar con la ayuda del seguro (puede ser ms barato con su seguro), pero el sitio web puede darle el precio si no utiliz ningn seguro.  - Puede imprimir el cupn correspondiente y llevarlo con su receta a la farmacia.  - Tambin puede pasar por nuestra oficina durante el horario de atencin regular y recoger una tarjeta de cupones de GoodRx.  - Si necesita que su receta se enve electrnicamente a una farmacia diferente, informe a nuestra oficina a travs de MyChart de [place] o por telfono llamando al [phone] y presione la opcin 4.  

## 2021-11-09 ENCOUNTER — Telehealth: Payer: Self-pay

## 2021-11-09 NOTE — Telephone Encounter (Signed)
-----   Message from Brendolyn Patty, MD sent at 11/09/2021  8:33 AM EST ----- Skin , right chest HYPERTROPHIC SCAR  Benign thickened scar, no skin cancer detected - please call patient

## 2021-11-09 NOTE — Telephone Encounter (Signed)
Discussed pathology results. Patient voiced understanding.  

## 2021-12-23 ENCOUNTER — Other Ambulatory Visit: Payer: Self-pay

## 2021-12-23 DIAGNOSIS — R6889 Other general symptoms and signs: Secondary | ICD-10-CM

## 2021-12-23 LAB — POCT INFLUENZA A/B
Influenza A, POC: NEGATIVE
Influenza B, POC: NEGATIVE

## 2021-12-23 LAB — POC COVID19 BINAXNOW: SARS Coronavirus 2 Ag: NEGATIVE

## 2021-12-23 NOTE — Progress Notes (Signed)
x2 weeks sinus issues, exposed to flu monday, as of this morning having body aches and weakness. covid and flu

## 2022-01-02 ENCOUNTER — Other Ambulatory Visit: Payer: Self-pay | Admitting: Physician Assistant

## 2022-01-02 DIAGNOSIS — E039 Hypothyroidism, unspecified: Secondary | ICD-10-CM

## 2022-01-19 ENCOUNTER — Encounter: Payer: 59 | Admitting: Physician Assistant

## 2022-02-03 ENCOUNTER — Ambulatory Visit
Admission: RE | Admit: 2022-02-03 | Discharge: 2022-02-03 | Disposition: A | Discharge status: Home / Self Care | Payer: No Typology Code available for payment source | Source: Ambulatory Visit | Attending: Physician Assistant | Admitting: Physician Assistant

## 2022-02-03 ENCOUNTER — Ambulatory Visit
Admission: RE | Admit: 2022-02-03 | Discharge: 2022-02-03 | Disposition: A | Discharge status: Home / Self Care | Payer: No Typology Code available for payment source | Attending: Physician Assistant | Admitting: Physician Assistant

## 2022-02-03 ENCOUNTER — Encounter: Payer: Self-pay | Admitting: Physician Assistant

## 2022-02-03 ENCOUNTER — Ambulatory Visit: Payer: Self-pay | Admitting: Physician Assistant

## 2022-02-03 ENCOUNTER — Other Ambulatory Visit: Payer: 59 | Admitting: Physician Assistant

## 2022-02-03 VITALS — BP 130/78 | HR 80 | Resp 14 | Ht 72.0 in | Wt 240.0 lb

## 2022-02-03 DIAGNOSIS — M79644 Pain in right finger(s): Secondary | ICD-10-CM | POA: Diagnosis present

## 2022-02-03 DIAGNOSIS — S80211A Abrasion, right knee, initial encounter: Secondary | ICD-10-CM

## 2022-02-03 DIAGNOSIS — S7001XA Contusion of right hip, initial encounter: Secondary | ICD-10-CM

## 2022-02-03 NOTE — Progress Notes (Addendum)
   Subjective: Right thumb and right knee pain    Patient ID: Samuel Padilla, male    DOB: 12-13-1970, 52 y.o.   MRN: [id number]  HPI Patient complain of right thumb right knee pain secondary to a fall.  Patient was investigated accident when he slipped and fell down embankment.  Denies LOC or head injury.  States pain and edema to the distal right thumb.  Pain to right hip.  Abrasions to the right knee.   Review of Systems Negative except for chief complaint   Objective:   Physical Exam BP is 130/78, pulse 80, respiration 14, patient is 97% O2 sat on room air.  Patient weighs 240 pounds and BMI is 32.55.  Patient is right-hand dominant.  No obvious deformity to the right thumb.  Neurovascular intact with free and equal l range of motion.  Moderate guarding palpation of the distal phalanx. X-ray reveals fracture of the distal phalanx first digit right hand.  Definitive reading from radiologist pending. Patient has developing ecchymosis right lateral hip.  No deformity.  Normal gait. Patient has abrasion to the anterior right knee.     Assessment & Plan: Fracture right thumb, right hip contusion, and abrasion right knee.  Patient's right thumb placed in aluminum splint.  Patient right knee abrasion was clean and bandaged.  Patient will follow-up with orthopedics for definitive evaluation and treatment.  Patient placed on restricted duty pending evaluation by Ortho.

## 2022-02-03 NOTE — Progress Notes (Incomplete Revision)
   Subjective: Right thumb and right knee pain    Patient ID: Samuel Padilla, male    DOB: Mar 18, 1970, 52 y.o.   MRN: [id number]  HPI Patient complain of right thumb right knee pain secondary to a fall.  Patient was investigated accident when he slipped and fell down embankment.  Denies LOC or head injury.  States pain and edema to the distal right thumb.  Pain to right hip.  Abrasions to the right knee.   Review of Systems Negative except for chief complaint   Objective:   Physical Exam BP is 130/78, pulse 80, respiration 14, patient is 97% O2 sat on room air.  Patient weighs 240 pounds and BMI is 32.55.  Patient is right-hand dominant.  No obvious deformity to the right thumb.  Neurovascular intact with free and equal l range of motion.  Moderate guarding palpation of the distal phalanx. X-ray reveals fracture of the distal phalanx first digit right hand.  Definitive reading from radiologist pending. Patient has developing ecchymosis right lateral hip.  No deformity.  Normal gait. Patient has abrasion to the anterior right knee.     Assessment & Plan: Fracture right thumb, right hip contusion, and abrasion right knee.  Patient's right thumb placed in aluminum splint.  Patient right knee abrasion was clean and bandaged.  Patient will follow-up with orthopedics for definitive evaluation and treatment.  Patient placed on restricted duty pending evaluation by Ortho.

## 2022-02-03 NOTE — Progress Notes (Signed)
Pt presents today WC DOI 02-03-22. Pt jammed right thumb, bruised at the top very painful and right knee scrapped.

## 2022-02-06 ENCOUNTER — Other Ambulatory Visit: Payer: Self-pay | Admitting: Physician Assistant

## 2022-02-06 DIAGNOSIS — F419 Anxiety disorder, unspecified: Secondary | ICD-10-CM

## 2022-02-22 ENCOUNTER — Other Ambulatory Visit: Payer: Self-pay

## 2022-02-22 DIAGNOSIS — F419 Anxiety disorder, unspecified: Secondary | ICD-10-CM

## 2022-02-22 MED ORDER — FLUOXETINE HCL 20 MG PO CAPS: 20.0000 mg | ORAL_CAPSULE | Freq: Every day | ORAL | 3 refills | Status: DC

## 2022-02-25 ENCOUNTER — Ambulatory Visit: Payer: Self-pay

## 2022-02-25 DIAGNOSIS — Z0000 Encounter for general adult medical examination without abnormal findings: Secondary | ICD-10-CM

## 2022-02-25 LAB — POCT URINALYSIS DIPSTICK
Bilirubin, UA: NEGATIVE
Glucose, UA: NEGATIVE
Ketones, UA: NEGATIVE
Leukocytes, UA: NEGATIVE
Nitrite, UA: NEGATIVE
Protein, UA: NEGATIVE
Spec Grav, UA: >=1.03 — AB (ref 1.010–1.025)
Urobilinogen, UA: 0.2 E.U./dL
pH, UA: 6 (ref 5.0–8.0)

## 2022-02-25 NOTE — Progress Notes (Signed)
Pt presents today to complete lab portion of physical. Pt requested hearing due to possible over time of hearing loss and PFT due to post covid symptoms short of breath.

## 2022-02-26 LAB — CMP12+LP+TP+TSH+6AC+PSA+CBC…
ALT: 27 IU/L (ref 0–44)
AST: 20 IU/L (ref 0–40)
Albumin/Globulin Ratio: 1.8 (ref 1.2–2.2)
Albumin: 4.4 g/dL (ref 3.8–4.9)
Alkaline Phosphatase: 114 IU/L (ref 44–121)
BUN/Creatinine Ratio: 11 (ref 9–20)
BUN: 11 mg/dL (ref 6–24)
Basophils Absolute: 0.1 10*3/uL (ref 0.0–0.2)
Basos: 1 %
Bilirubin Total: 0.5 mg/dL (ref 0.0–1.2)
Calcium: 9.1 mg/dL (ref 8.7–10.2)
Chloride: 104 mmol/L (ref 96–106)
Chol/HDL Ratio: 3.8 ratio (ref 0.0–5.0)
Cholesterol, Total: 138 mg/dL (ref 100–199)
Creatinine, Ser: 0.97 mg/dL (ref 0.76–1.27)
EOS (ABSOLUTE): 0.4 10*3/uL (ref 0.0–0.4)
Eos: 5 %
Estimated CHD Risk: 0.7 times avg. (ref 0.0–1.0)
Free Thyroxine Index: 2.3 (ref 1.2–4.9)
GGT: 29 IU/L (ref 0–65)
Globulin, Total: 2.4 g/dL (ref 1.5–4.5)
Glucose: 93 mg/dL (ref 70–99)
HDL: 36 mg/dL — ABNORMAL LOW (ref >39)
Hematocrit: 49.4 % (ref 37.5–51.0)
Hemoglobin: 16.5 g/dL (ref 13.0–17.7)
Immature Grans (Abs): 0 10*3/uL (ref 0.0–0.1)
Immature Granulocytes: 0 %
Iron: 88 ug/dL (ref 38–169)
LDH: 167 IU/L (ref 121–224)
LDL Chol Calc (NIH): 74 mg/dL (ref 0–99)
Lymphocytes Absolute: 2.2 10*3/uL (ref 0.7–3.1)
Lymphs: 24 %
MCH: 30.8 pg (ref 26.6–33.0)
MCHC: 33.4 g/dL (ref 31.5–35.7)
MCV: 92 fL (ref 79–97)
Monocytes Absolute: 0.7 10*3/uL (ref 0.1–0.9)
Monocytes: 7 %
Neutrophils Absolute: 5.8 10*3/uL (ref 1.4–7.0)
Neutrophils: 63 %
Phosphorus: 2.7 mg/dL — ABNORMAL LOW (ref 2.8–4.1)
Platelets: 231 10*3/uL (ref 150–450)
Potassium: 4.3 mmol/L (ref 3.5–5.2)
Prostate Specific Ag, Serum: 1.6 ng/mL (ref 0.0–4.0)
RBC: 5.35 x10E6/uL (ref 4.14–5.80)
RDW: 12.5 % (ref 11.6–15.4)
Sodium: 144 mmol/L (ref 134–144)
T3 Uptake Ratio: 24 % (ref 24–39)
T4, Total: 9.7 ug/dL (ref 4.5–12.0)
TSH: 1.78 u[IU]/mL (ref 0.450–4.500)
Total Protein: 6.8 g/dL (ref 6.0–8.5)
Triglycerides: 162 mg/dL — ABNORMAL HIGH (ref 0–149)
Uric Acid: 6.1 mg/dL (ref 3.8–8.4)
VLDL Cholesterol Cal: 28 mg/dL (ref 5–40)
WBC: 9.2 10*3/uL (ref 3.4–10.8)
eGFR: 95 mL/min/{1.73_m2} (ref >59)

## 2022-02-28 ENCOUNTER — Emergency Department
Admission: EM | Admit: 2022-02-28 | Discharge: 2022-02-28 | Disposition: A | Discharge status: Home / Self Care | Payer: 59 | Attending: Emergency Medicine | Admitting: Emergency Medicine

## 2022-02-28 ENCOUNTER — Emergency Department: Payer: 59

## 2022-02-28 ENCOUNTER — Other Ambulatory Visit: Payer: Self-pay

## 2022-02-28 DIAGNOSIS — Z85828 Personal history of other malignant neoplasm of skin: Secondary | ICD-10-CM | POA: Insufficient documentation

## 2022-02-28 DIAGNOSIS — Z8616 Personal history of COVID-19: Secondary | ICD-10-CM | POA: Diagnosis not present

## 2022-02-28 DIAGNOSIS — Z79899 Other long term (current) drug therapy: Secondary | ICD-10-CM | POA: Insufficient documentation

## 2022-02-28 DIAGNOSIS — Z79890 Hormone replacement therapy (postmenopausal): Secondary | ICD-10-CM | POA: Diagnosis not present

## 2022-02-28 DIAGNOSIS — E039 Hypothyroidism, unspecified: Secondary | ICD-10-CM | POA: Insufficient documentation

## 2022-02-28 DIAGNOSIS — I2510 Atherosclerotic heart disease of native coronary artery without angina pectoris: Secondary | ICD-10-CM | POA: Diagnosis not present

## 2022-02-28 DIAGNOSIS — Z7982 Long term (current) use of aspirin: Secondary | ICD-10-CM | POA: Diagnosis not present

## 2022-02-28 DIAGNOSIS — J40 Bronchitis, not specified as acute or chronic: Secondary | ICD-10-CM

## 2022-02-28 DIAGNOSIS — Z1152 Encounter for screening for COVID-19: Secondary | ICD-10-CM | POA: Insufficient documentation

## 2022-02-28 DIAGNOSIS — R0989 Other specified symptoms and signs involving the circulatory and respiratory systems: Secondary | ICD-10-CM

## 2022-02-28 DIAGNOSIS — R070 Pain in throat: Secondary | ICD-10-CM | POA: Diagnosis not present

## 2022-02-28 DIAGNOSIS — R220 Localized swelling, mass and lump, head: Secondary | ICD-10-CM | POA: Diagnosis present

## 2022-02-28 LAB — BASIC METABOLIC PANEL
Anion gap: 6 (ref 5–15)
BUN: 13 mg/dL (ref 6–20)
CO2: 29 mmol/L (ref 22–32)
Calcium: 8.8 mg/dL — ABNORMAL LOW (ref 8.9–10.3)
Chloride: 106 mmol/L (ref 98–111)
Creatinine, Ser: 1.07 mg/dL (ref 0.61–1.24)
GFR, Estimated: >60 mL/min (ref >60)
Glucose, Bld: 116 mg/dL — ABNORMAL HIGH (ref 70–99)
Potassium: 3.8 mmol/L (ref 3.5–5.1)
Sodium: 141 mmol/L (ref 135–145)

## 2022-02-28 LAB — CBC WITH DIFFERENTIAL/PLATELET
Abs Immature Granulocytes: 0.06 10*3/uL (ref 0.00–0.07)
Basophils Absolute: 0.1 10*3/uL (ref 0.0–0.1)
Basophils Relative: 0 %
Eosinophils Absolute: 0.6 10*3/uL — ABNORMAL HIGH (ref 0.0–0.5)
Eosinophils Relative: 5 %
HCT: 49.4 % (ref 39.0–52.0)
Hemoglobin: 16.2 g/dL (ref 13.0–17.0)
Immature Granulocytes: 1 %
Lymphocytes Relative: 12 %
Lymphs Abs: 1.4 10*3/uL (ref 0.7–4.0)
MCH: 31 pg (ref 26.0–34.0)
MCHC: 32.8 g/dL (ref 30.0–36.0)
MCV: 94.5 fL (ref 80.0–100.0)
Monocytes Absolute: 0.9 10*3/uL (ref 0.1–1.0)
Monocytes Relative: 7 %
Neutro Abs: 9 10*3/uL — ABNORMAL HIGH (ref 1.7–7.7)
Neutrophils Relative %: 75 %
Platelets: 197 10*3/uL (ref 150–400)
RBC: 5.23 MIL/uL (ref 4.22–5.81)
RDW: 13 % (ref 11.5–15.5)
WBC: 12 10*3/uL — ABNORMAL HIGH (ref 4.0–10.5)
nRBC: 0 % (ref 0.0–0.2)

## 2022-02-28 LAB — RESP PANEL BY RT-PCR (RSV, FLU A&B, COVID)  RVPGX2
Influenza A by PCR: NEGATIVE
Influenza B by PCR: NEGATIVE
Resp Syncytial Virus by PCR: NEGATIVE
SARS Coronavirus 2 by RT PCR: NEGATIVE

## 2022-02-28 LAB — TROPONIN I (HIGH SENSITIVITY)
Troponin I (High Sensitivity): 3 ng/L (ref <18)
Troponin I (High Sensitivity): 3 ng/L (ref <18)

## 2022-02-28 LAB — GROUP A STREP BY PCR: Group A Strep by PCR: NOT DETECTED

## 2022-02-28 MED ORDER — DEXAMETHASONE SODIUM PHOSPHATE 10 MG/ML IJ SOLN
10.0000 mg | Freq: Once | INTRAMUSCULAR | Status: AC
  Administered 2022-02-28: 10 mg via INTRAVENOUS
  Filled 2022-02-28: qty 1

## 2022-02-28 MED ORDER — AZITHROMYCIN 250 MG PO TABS: ORAL_TABLET | ORAL | 0 refills | Status: AC

## 2022-02-28 MED ORDER — FAMOTIDINE IN NACL 20-0.9 MG/50ML-% IV SOLN
20.0000 mg | Freq: Once | INTRAVENOUS | Status: AC
  Administered 2022-02-28: 20 mg via INTRAVENOUS
  Filled 2022-02-28: qty 50

## 2022-02-28 MED ORDER — DIPHENHYDRAMINE HCL 50 MG/ML IJ SOLN
50.0000 mg | Freq: Once | INTRAMUSCULAR | Status: AC
  Administered 2022-02-28: 50 mg via INTRAVENOUS
  Filled 2022-02-28: qty 1

## 2022-02-28 NOTE — ED Triage Notes (Addendum)
Pt arrives via POV with CC of difficulty swallowing. Pt reports difficulty since yesterday afternoon - coughing woke pt from sleep and feeling of throat tightness had increased. Pt able to answer questions - pt does sound hoarse at this time. Reports hx of food allergies but denies ingestion of known irritants. Pt has taken three doses of Alavert - last dose at midnight. Reports temporary relief that does not last.

## 2022-02-28 NOTE — ED Notes (Signed)
Pt unhooked to use restroom.

## 2022-02-28 NOTE — ED Notes (Signed)
Pt hooked back up to monitoring equipment

## 2022-02-28 NOTE — ED Provider Notes (Signed)
[facility] Provider Note    Event Date/Time   First MD Initiated Contact with Patient 02/28/22 [phone]     (approximate)   History   Oral Swelling   HPI  Samuel Padilla is a 52 y.o. male with history of CAD, hypothyroidism, hyperlipidemia who presents to the emergency department with complaints of throat tightness.  He states he feels like his throat is tight and it is difficult for him to swallow.  He is also having a dry cough.  No fevers or sick contacts.  He states this feels similar to when he has had allergic reactions but denies any new exposures.  He is not on an ACE inhibitor and has no lip or tongue swelling.  No chest tightness.  States he is feeling short of breath but this has been chronic since he had COVID at the beginning of the year.  No rash, itching.   History provided by patient, wife.    Past Medical History:  Diagnosis Date   Allergy    Anxiety    Basal cell carcinoma 06/04/2009   Right post. arm   Basal cell carcinoma 11/23/2013   right lat. chest. Superficial BCC, Pinkus type.   Basal cell carcinoma 11/23/2013   left med. post. shoulder. Nodular   Basal cell carcinoma 11/23/2013   right spinal upper back. Superficial   Basal cell carcinoma 11/23/2013   left spinal upper back. Superficial.   Basal cell carcinoma 02/17/2016   spinal mid upper back. Superficial   Basal cell carcinoma 08/24/2016   right upper back. Superficial   Basal cell carcinoma 03/13/2018   medial lower chest. Superficial, nodular   Basal cell carcinoma 03/26/2019   L spinal upper back - ED&C    Basal cell carcinoma 03/26/2019   Post neck - ED&C    Basal cell carcinoma 03/25/2020   R medial clavicle, EDC   BCC (basal cell carcinoma) 09/29/2020   R chest, EDC 10/22/2020   CAD (coronary artery disease)    Colon polyps    Depression    Dysplastic nevus 06/04/2009   Left post. shoulder. Moderate atypia.   Elevated lipids    Family history of colon  cancer    GERD (gastroesophageal reflux disease)    Hodgkin's lymphoma ([place])    Hypothyroidism    Overweight     Past Surgical History:  Procedure Laterality Date   COLONOSCOPY  06/05/2014   repeat 2019   ESOPHAGOGASTRODUODENOSCOPY  06/05/2014   TUNNELED VENOUS CATHETER PLACEMENT  1989    MEDICATIONS:  Prior to Admission medications   Medication Sig Start Date End Date Taking? Authorizing Provider  aspirin EC 81 MG tablet Take 81 mg by mouth in the morning and at bedtime. Swallow whole.    [provider]  atorvastatin (LIPITOR) 80 MG tablet atorvastatin 80 mg tablet    [provider]  famotidine (PEPCID) 20 MG tablet Take 20 mg by mouth daily.    [provider]  FLUoxetine (PROZAC) 20 MG capsule Take 1 capsule (20 mg total) by mouth daily. 02/22/22   Sable Feil, PA-C  levothyroxine (SYNTHROID) 100 MCG tablet Take 1 tablet (100 mcg total) by mouth daily before breakfast. 03/22/21   Sable Feil, PA-C  metoprolol succinate (TOPROL-XL) 25 MG 24 hr tablet Take by mouth. 10/03/17 10/19/21  [provider]    Physical Exam   Triage Vital Signs: ED Triage Vitals [02/28/22 0445]  Enc Vitals Group  BP (!) 149/75     Pulse Rate 81     Resp 19     Temp 97.7 F (36.5 C)     Temp Source Oral     SpO2 97 %     Weight 240 lb (108.9 kg)     Height 6' (1.829 m)     Head Circumference      Peak Flow      Pain Score 0     Pain Loc      Pain Edu?      Excl. in [place]?     Most recent vital signs: Vitals:   02/28/22 0445  BP: (!) 149/75  Pulse: 81  Resp: 19  Temp: 97.7 F (36.5 C)  SpO2: 97%    CONSTITUTIONAL: Alert, responds appropriately to questions. Well-appearing; well-nourished, appears slightly anxious HEAD: Normocephalic, atraumatic EYES: Conjunctivae clear, pupils appear equal, sclera nonicteric ENT: normal nose; moist mucous membranes; No pharyngeal erythema or petechiae, no tonsillar hypertrophy or exudate, no uvular  deviation, no unilateral swelling in posterior oropharynx, no trismus or drooling, no muffled voice, normal phonation, no stridor, airway patent.  Posterior oropharynx is completely patent without any swelling.  No lip or tongue swelling.  Tongue sits flat in the bottom of the mouth. NECK: Supple, normal ROM, no thyromegaly or cervical lymphadenopathy CARD: RRR; S1 and S2 appreciated RESP: Normal chest excursion without splinting or tachypnea; breath sounds clear and equal bilaterally; no wheezes, no rhonchi, no rales, no hypoxia or respiratory distress, speaking full sentences ABD/GI: Non-distended; soft, non-tender, no rebound, no guarding, no peritoneal signs BACK: The back appears normal EXT: Normal ROM in all joints; no deformity noted, no edema SKIN: Normal color for age and race; warm; no rash on exposed skin, no urticaria NEURO: Moves all extremities equally, normal speech PSYCH: The patient's mood and manner are appropriate.   ED Results / Procedures / Treatments   LABS: (all labs ordered are listed, but only abnormal results are displayed) Labs Reviewed  CBC WITH DIFFERENTIAL/PLATELET - Abnormal; Notable for the following components:      Result Value   WBC 12.0 (*)    Neutro Abs 9.0 (*)    Eosinophils Absolute 0.6 (*)    All other components within normal limits  BASIC METABOLIC PANEL - Abnormal; Notable for the following components:   Glucose, Bld 116 (*)    Calcium 8.8 (*)    All other components within normal limits  RESP PANEL BY RT-PCR (RSV, FLU A&B, COVID)  RVPGX2  GROUP A STREP BY PCR  TROPONIN I (HIGH SENSITIVITY)  TROPONIN I (HIGH SENSITIVITY)     EKG:  EKG Interpretation  Date/Time:  Sunday February 28 2022 05:14:05 EST Ventricular Rate:  83 PR Interval:  160 QRS Duration: 107 QT Interval:  392 QTC Calculation: 461 R Axis:   111 Text Interpretation: Sinus rhythm Probable inferior infarct, old Confirmed by Pryor Curia [phone]) on 02/28/2022 5:15:55 AM          RADIOLOGY: My personal review and interpretation of imaging: Chest x-ray shows  mild bronchitis.  I have personally reviewed all radiology reports.   DG Chest Portable 1 View  Result Date: 02/28/2022 CLINICAL DATA:  52 year old male with history of shortness of breath and throat tightness. EXAM: PORTABLE CHEST 1 VIEW COMPARISON:  Chest x-ray 11/21/2015. FINDINGS: Lung volumes are normal. Some scattered areas of interstitial prominence and peribronchial cuffing are noted, most evident in the mid to lower lungs. No consolidative airspace disease. No  pleural effusions. No pneumothorax. No pulmonary nodule or mass noted. Pulmonary vasculature and the cardiomediastinal silhouette are within normal limits. IMPRESSION: 1. Findings could suggest very mild acute bronchitis, as above. Electronically Signed   By: Vinnie Langton M.D.   On: 02/28/2022 05:23     PROCEDURES:  Critical Care performed: No     .1-3 Lead EKG Interpretation  Performed by: Zakarie Sturdivant, Delice Bison, DO Authorized by: Christal Lagerstrom, Delice Bison, DO     Interpretation: normal     ECG rate:  81   ECG rate assessment: normal     Rhythm: sinus rhythm     Ectopy: none     Conduction: normal       IMPRESSION / MDM / ASSESSMENT AND PLAN / ED COURSE  I reviewed the triage vital signs and the nursing notes.    Patient here with throat tightness.  The patient is on the cardiac monitor to evaluate for evidence of arrhythmia and/or significant heart rate changes.   DIFFERENTIAL DIAGNOSIS (includes but not limited to):   Allergic reaction, pharyngitis, viral URI, pneumonia, anginal equivalent, anxiety   Patient's presentation is most consistent with acute presentation with potential threat to life or bodily function.   PLAN: Will obtain EKG, CBC, BMP, troponin x 2, chest x-ray in case this is his anginal equivalent..  Will also give IV Decadron, Benadryl and Pepcid for symptomatic relief and keep on cardiac monitoring.  Will  obtain strep and COVID swabs.  No sign of PTA or deep space neck infection.  No sign of angioedema.  He is not on an ACE inhibitor.  He is phonating normally and he has no posterior swelling noted.  Will continue to monitor.   MEDICATIONS GIVEN IN ED: Medications  diphenhydrAMINE (BENADRYL) injection 50 mg (50 mg Intravenous Given 02/28/22 0537)  dexamethasone (DECADRON) injection 10 mg (10 mg Intravenous Given 02/28/22 0535)  famotidine (PEPCID) IVPB 20 mg premix (0 mg Intravenous Stopped 02/28/22 0622)     ED COURSE: COVID, flu, RSV and strep negative.  Chest x-ray reviewed and interpreted by myself and the radiologist and shows mild bronchitis.  His lungs are clear to auscultation without wheezing.  No hypoxia.  First troponin negative.  Normal hemoglobin, electrolytes.  Reports he is feeling better.  Still phonating normally and tolerating p.o.  Plan will be to obtain second troponin at 7 AM and if this is negative, discharge home.  Discussed with him likely viral illness causing his symptoms but will discharge with azithromycin if symptoms not improving in several days as he states he does not have a regular PCP.  He does not need further steroid course at this time as he was given a dose of Decadron here.  Patient and wife verbalized understanding.   Has allergy listed to erythromycin but patient states he has taken azithromycin before without any issues.  CONSULTS:  none   OUTSIDE RECORDS REVIEWED: Reviewed last office visit with internal medicine on 01/05/2022 where he was diagnosed with COVID-19.       FINAL CLINICAL IMPRESSION(S) / ED DIAGNOSES   Final diagnoses:  Throat tightness  Bronchitis     Rx / DC Orders   ED Discharge Orders          Ordered    azithromycin (ZITHROMAX Z-PAK) 250 MG tablet        02/28/22 [id number]             Note:  This document was prepared using Dragon voice recognition software and  may include unintentional dictation errors.   Geneal Huebert,  Delice Bison, DO 02/28/22 [phone]

## 2022-02-28 NOTE — Discharge Instructions (Addendum)
You may continue over-the-counter Benadryl 50 mg every 8 hours as needed for symptoms of allergic reaction.  You may use over-the-counter dextromethorphan, guaifenesin as needed for cough.  Using warm salt water gargles and honey can also help with postnasal drainage and cough.  You may alternate Tylenol 1000 mg every 6 hours as needed for pain, fever and Ibuprofen 800 mg every 6-8 hours as needed for pain, fever.  Please take Ibuprofen with food.  Do not take more than 4000 mg of Tylenol (acetaminophen) in a 24 hour period.   Please go to the following website to schedule new (and existing) patient appointments:   [email or web address]   The following is a list of primary care offices in the area who are accepting new patients at this time.  Please reach out to one of them directly and let them know you would like to schedule an appointment to follow up on an Emergency Department visit, and/or to establish a new primary care provider (PCP).  There are likely other primary care clinics in the are who are accepting new patients, but this is an excellent place to start:  [place] physician: Dr Lavon Paganini [address] [place], [place] [address] [phone]  [facility] Lead Physician: Dr Steele Sizer [address], [place], [place] [address] [phone]  [place] Physician: Dr Park Liter [address] [place], [place] [address] [phone]  [facility] Lead Physician: Dr Dewaine Oats [address], [place], [place] [address] [phone]  [facility] [facility] Physician: Dr Halina Maidens [address] [place], [place],  [address] [phone]

## 2022-03-04 ENCOUNTER — Encounter: Payer: Self-pay | Admitting: Physician Assistant

## 2022-03-04 ENCOUNTER — Ambulatory Visit: Payer: Self-pay | Admitting: Physician Assistant

## 2022-03-04 VITALS — BP 140/80 | HR 85 | Resp 14 | Ht 72.0 in | Wt 240.0 lb

## 2022-03-04 DIAGNOSIS — Z0000 Encounter for general adult medical examination without abnormal findings: Secondary | ICD-10-CM

## 2022-03-04 NOTE — Progress Notes (Signed)
Pt presents today to complete physical. Pt was dx with bronchitis 02-27-22

## 2022-03-04 NOTE — Progress Notes (Signed)
[facility] ____________________________________________   None    (approximate)  I have reviewed the triage vital signs and the nursing notes.   HISTORY  Chief Complaint No chief complaint on file.   HPI Samuel Padilla is a 52 y.o. male patient presents for annual physical exam.  Patient was seen emergency room 5 days ago and diagnosed with bronchitis.  Patient given a Z-Pak and cough medicine.  Patient states feeling better.         Past Medical History:  Diagnosis Date   Allergy    Anxiety    Basal cell carcinoma 06/04/2009   Right post. arm   Basal cell carcinoma 11/23/2013   right lat. chest. Superficial BCC, Pinkus type.   Basal cell carcinoma 11/23/2013   left med. post. shoulder. Nodular   Basal cell carcinoma 11/23/2013   right spinal upper back. Superficial   Basal cell carcinoma 11/23/2013   left spinal upper back. Superficial.   Basal cell carcinoma 02/17/2016   spinal mid upper back. Superficial   Basal cell carcinoma 08/24/2016   right upper back. Superficial   Basal cell carcinoma 03/13/2018   medial lower chest. Superficial, nodular   Basal cell carcinoma 03/26/2019   L spinal upper back - ED&C    Basal cell carcinoma 03/26/2019   Post neck - ED&C    Basal cell carcinoma 03/25/2020   R medial clavicle, EDC   BCC (basal cell carcinoma) 09/29/2020   R chest, EDC 10/22/2020   CAD (coronary artery disease)    Colon polyps    Depression    Dysplastic nevus 06/04/2009   Left post. shoulder. Moderate atypia.   Elevated lipids    Family history of colon cancer    GERD (gastroesophageal reflux disease)    Hodgkin's lymphoma ([place])    Hypothyroidism    Overweight     Patient Active Problem List   Diagnosis Date Noted   Patellofemoral dysfunction of right knee 09/07/2018   Anserine bursitis 09/07/2018   Hypothyroidism 01/09/2018   Excessive daytime sleepiness 12/02/2016   At risk for cardiac dysfunction  07/13/2016   Benign essential HTN 08/07/2015   Mixed hyperlipidemia 08/07/2015   Shortness of breath 08/07/2015    Past Surgical History:  Procedure Laterality Date   COLONOSCOPY  06/05/2014   repeat 2019   ESOPHAGOGASTRODUODENOSCOPY  06/05/2014   TUNNELED [place]    Prior to Admission medications   Medication Sig Start Date End Date Taking? Authorizing Provider  aspirin EC 81 MG tablet Take 81 mg by mouth in the morning and at bedtime. Swallow whole.    [provider]  atorvastatin (LIPITOR) 80 MG tablet atorvastatin 80 mg tablet    [provider]  azithromycin (ZITHROMAX Z-PAK) 250 MG tablet Take 2 tablets (500 mg) on  Day 1,  followed by 1 tablet (250 mg) once daily on Days 2 through 5. 02/28/22 03/05/22  Ward, Delice Bison, DO  famotidine (PEPCID) 20 MG tablet Take 20 mg by mouth daily.    [provider]  FLUoxetine (PROZAC) 20 MG capsule Take 1 capsule (20 mg total) by mouth daily. 02/22/22   Sable Feil, PA-C  levothyroxine (SYNTHROID) 100 MCG tablet Take 1 tablet (100 mcg total) by mouth daily before breakfast. 03/22/21   Sable Feil, PA-C  metoprolol succinate (TOPROL-XL) 25 MG 24 hr tablet Take by mouth. 10/03/17 10/19/21  [provider]    Allergies Cheese, Erythromycin, and Milk (  cow)  Family History  Problem Relation Age of Onset   Diverticulitis Mother    Colon cancer Father    Melanoma Father    COPD Father     Social History Social History   Tobacco Use   Smoking status: Never   Smokeless tobacco: Never  Substance Use Topics   Alcohol use: No   Drug use: Never    Review of Systems Constitutional: No fever/chills Eyes: No visual changes. ENT: No sore throat. Cardiovascular: Denies chest pain. Respiratory: Denies shortness of breath. Gastrointestinal: No abdominal pain.  No nausea, no vomiting.  No diarrhea.  No constipation. Genitourinary: Negative for dysuria. Musculoskeletal: Negative  for back pain. Skin: Negative for rash. Neurological: Negative for headaches, focal weakness or numbness. Psychiatric: Anxiety and depression Endocrine: Hyperlipidemia, hypothyroidism, and hypertension Hematological/Lymphatic:  Allergic/Immunilogical:Cheese, erythromycin, and cow milk.  __________________________________________   PHYSICAL EXAM:  VITAL SIGNS: BP is 140/80, pulse 85, respiration 14, patient is 96% O2 sat on room air.  Patient weighs 240 pounds and BMI is 32.55. Constitutional: Alert and oriented. Well appearing and in no acute distress. Eyes: Conjunctivae are normal. PERRL. EOMI. Head: Atraumatic. Nose: No congestion/rhinnorhea. Mouth/Throat: Mucous membranes are moist.  Oropharynx non-erythematous. Neck: No stridor.  No cervical spine tenderness to palpation. Hematological/Lymphatic/Immunilogical: No cervical lymphadenopathy. Cardiovascular: Normal rate, regular rhythm. Grossly normal heart sounds.  Good peripheral circulation. Respiratory: Normal respiratory effort.  No retractions. Lungs CTAB. Gastrointestinal: Soft and nontender. No distention. No abdominal bruits. No CVA tenderness. Genitourinary: Deferred Musculoskeletal: No lower extremity tenderness nor edema.  No joint effusions. Neurologic:  Normal speech and language. No gross focal neurologic deficits are appreciated. No gait instability. Skin:  Skin is warm, dry and intact. No rash noted. Psychiatric: Mood and affect are normal. Speech and behavior are normal.  ____________________________________________   LABS ___           Component Ref Range & Units 7 d ago (02/25/22) 1 yr ago (01/14/21) 1 yr ago (06/25/20) 2 yr ago (01/28/20) 3 yr ago (12/27/18) 4 yr ago (10/20/17) 4 yr ago (04/13/17)  Glucose 70 - 99 mg/dL 93 97  93 R 93 R    Uric Acid 3.8 - 8.4 mg/dL 6.1 6.4 CM  6.6 CM 6.4 CM    Comment:            Therapeutic target for gout patients: <6.0  BUN 6 - 24 mg/dL 11 11  12 10    $ Creatinine, Ser  0.76 - 1.27 mg/dL 0.97 0.99  1.02 1.12    eGFR >59 mL/min/1.73 95 93       BUN/Creatinine Ratio 9 - 20 11 11  12 9    $ Sodium 134 - 144 mmol/L 144 144  142 143    Potassium 3.5 - 5.2 mmol/L 4.3 4.4  4.4 4.3    Chloride 96 - 106 mmol/L 104 105  104 105    Calcium 8.7 - 10.2 mg/dL 9.1 9.1  8.8 8.6 Low     Phosphorus 2.8 - 4.1 mg/dL 2.7 Low  3.1  2.6 Low  2.8    Total Protein 6.0 - 8.5 g/dL 6.8 6.4  6.3 6.6    Albumin 3.8 - 4.9 g/dL 4.4 4.3 R  4.2 R 4.3 R    Globulin, Total 1.5 - 4.5 g/dL 2.4 2.1  2.1 2.3    Albumin/Globulin Ratio 1.2 - 2.2 1.8 2.0  2.0 1.9    Bilirubin Total 0.0 - 1.2 mg/dL 0.5 0.6  0.8 0.7  Alkaline Phosphatase 44 - 121 IU/L 114 100  105 99 R    LDH 121 - 224 IU/L 167 152  161 193    AST 0 - 40 IU/L 20 16  15 19    $ ALT 0 - 44 IU/L 27 17  17 17    $ GGT 0 - 65 IU/L 29 23  29 27    $ Iron 38 - 169 ug/dL 88 82  75 90    Cholesterol, Total 100 - 199 mg/dL 138 136  134 155    Triglycerides 0 - 149 mg/dL 162 High  138  105 168 High     HDL >39 mg/dL 36 Low  35 Low   32 Low  32 Low     VLDL Cholesterol Cal 5 - 40 mg/dL 28 24  20 29    $ LDL Chol Calc (NIH) 0 - 99 mg/dL 74 77  82 94    Chol/HDL Ratio 0.0 - 5.0 ratio 3.8 3.9 CM  4.2 CM 4.8 CM    Comment:                                   T. Chol/HDL Ratio                                             Men  Women                               1/2 Avg.Risk  3.4    3.3                                   Avg.Risk  5.0    4.4                                2X Avg.Risk  9.6    7.1                                3X Avg.Risk 23.4   11.0  Estimated CHD Risk 0.0 - 1.0 times avg. 0.7 0.7 CM  0.8 CM 1.0 CM    Comment: The CHD Risk is based on the T. Chol/HDL ratio. Other factors affect CHD Risk such as hypertension, smoking, diabetes, severe obesity, and family history of premature CHD.  TSH 0.450 - 4.500 uIU/mL 1.780 1.860  1.370 2.650 2.49 R 2.130 R  T4, Total 4.5 - 12.0 ug/dL 9.7 9.5  10.3 9.2  10.9 R  T3 Uptake Ratio 24 - 39 % 24 21 Low    25 22 Low     Free Thyroxine Index 1.2 - 4.9 2.3 2.0  2.6 2.0  2.7 R  Prostate Specific Ag, Serum 0.0 - 4.0 ng/mL 1.6 1.1 CM 1.0 CM 0.9 CM 1.1 CM    Comment: Roche ECLIA methodology. According to the American Urological Association, Serum PSA should decrease and remain at undetectable levels after radical prostatectomy. The AUA defines biochemical recurrence as an initial PSA value 0.2 ng/mL or greater followed by a subsequent confirmatory PSA value 0.2 ng/mL or greater.  Values obtained with different assay methods or kits cannot be used interchangeably. Results cannot be interpreted as absolute evidence of the presence or absence of malignant disease.  WBC 3.4 - 10.8 x10E3/uL 9.2 10.2  8.5 9.5    RBC 4.14 - 5.80 x10E6/uL 5.35 5.52  5.35 5.33    Hemoglobin 13.0 - 17.7 g/dL 16.5 17.2  16.9 17.0    Hematocrit 37.5 - 51.0 % 49.4 51.5 High   50.5 50.1    MCV 79 - 97 fL 92 93  94 94    MCH 26.6 - 33.0 pg 30.8 31.2  31.6 31.9    MCHC 31.5 - 35.7 g/dL 33.4 33.4  33.5 33.9    RDW 11.6 - 15.4 % 12.5 12.2  12.4 12.6    Platelets 150 - 450 x10E3/uL 231 246  240 236    Neutrophils Not Estab. % 63 67  69 68    Lymphs Not Estab. % 24 21  18 21    $ Monocytes Not Estab. % 7 7  8 7    $ Eos Not Estab. % 5 4  3 3    $ Basos Not Estab. % 1 1  1 1    $ Neutrophils Absolute 1.4 - 7.0 x10E3/uL 5.8 6.8  5.9 6.5    Lymphocytes Absolute 0.7 - 3.1 x10E3/uL 2.2 2.2  1.6 2.0    Monocytes Absolute 0.1 - 0.9 x10E3/uL 0.7 0.7  0.7 0.7    EOS (ABSOLUTE) 0.0 - 0.4 x10E3/uL 0.4 0.4  0.2 0.3    Basophils Absolute 0.0 - 0.2 x10E3/uL 0.1 0.1  0.0 0.1    Immature Granulocytes Not Estab. % 0 0  1 0    Immature Grans (Abs) 0.0 - 0.1 x10E3/uL 0.0 0.0  0.0 0.0    Resulting Agency  [facility]                 Component Ref Range & Units 7 d ago (02/25/22) 1 yr ago (01/14/21) 1 yr ago (06/25/20) 2 yr ago (01/28/20) 3 yr ago (12/27/18)  Color, UA  yellow dark yellow Yellow R dark yellow  Yellow  Clarity, UA  clear clear  Clear Clear  Glucose, UA Negative Negative Negative  Negative Negative  Bilirubin, UA  neg negative Negative R Negative Negative  Ketones, UA  neg negative Negative R Negative Negative  Spec Grav, UA 1.010 - 1.025 >=1.030 Abnormal  1.025 1.020 R >=1.030 Abnormal  1.015  Blood, UA  trace -+ trace CM Trace Abnormal  R Positive CM Negative  pH, UA 5.0 - 8.0 6.0 6.0 8.5 High  R 6.0 6.0  Protein, UA Negative Negative Positive Abnormal  CM Negative R Negative Negative  Urobilinogen, UA 0.2 or 1.0 E.U./dL 0.2 0.2  0.2 0.2  Nitrite, UA  neg negative Negative R Negative Negative  Leukocytes, UA Negative Negative Negative Negative Negative Negative  Appearance  medium dark                 ______________________________  EKG Normal sinus rhythm 84 bpm  ____________________________________________    ____________________________________________   INITIAL IMPRESSION / ASSESSMENT AND PLAN   As part of my medical decision making, I reviewed the following data within the electronic MEDICAL RECORD NUMBER       Discussed no acute findings on physical exam ,EKG, and labs.      ____________________________________________   FINAL CLINICAL IMPRESSION  Well exam  ED Discharge Orders     None  Note:  This document was prepared using Dragon voice recognition software and may include unintentional dictation errors.

## 2022-03-18 DIAGNOSIS — S62521A Displaced fracture of distal phalanx of right thumb, initial encounter for closed fracture: Secondary | ICD-10-CM | POA: Insufficient documentation

## 2022-03-22 ENCOUNTER — Ambulatory Visit: Payer: Self-pay | Admitting: Physician Assistant

## 2022-03-22 ENCOUNTER — Encounter: Payer: Self-pay | Admitting: Physician Assistant

## 2022-03-22 VITALS — BP 132/77 | HR 67 | Resp 14 | Ht 72.0 in | Wt 240.0 lb

## 2022-03-22 DIAGNOSIS — M79644 Pain in right finger(s): Secondary | ICD-10-CM

## 2022-03-22 NOTE — Progress Notes (Signed)
   Subjective: Fracture right thumb    Patient ID: Samuel Padilla, male    DOB: Jul 25, 1970, 52 y.o.   MRN: [id number]  HPI Patient follow-up secondary to fracture right thumb which occurred on 02/03/2022.  Patient has been followed by   Review of Systems Negative except for chief complaint    Objective:   Physical Exam Vital signs not taken. Examination of the right thumb shows no obvious deformity edema or erythema.  Patient has full and equal range of motion.  Neurovascular intact.       Assessment & Plan: Right thumb fracture   Patient return back to full duty.

## 2022-03-22 NOTE — Progress Notes (Signed)
Pt presents today for RTW note for right thumb.

## 2022-04-14 ENCOUNTER — Telehealth: Payer: Self-pay

## 2022-04-14 NOTE — Telephone Encounter (Signed)
PFT results sent to [facility] at pt request.

## 2022-04-21 ENCOUNTER — Other Ambulatory Visit: Payer: Self-pay

## 2022-04-21 NOTE — Telephone Encounter (Signed)
[place]land Jack called [facility] & left a message requesting Rx refill for Levothyroxine.  [facility] sent an electronic Rx refill request for Levothyroxine.  Routed the refill request already  in Epic to [employer], PA-C.  AMD

## 2022-05-11 ENCOUNTER — Ambulatory Visit (INDEPENDENT_AMBULATORY_CARE_PROVIDER_SITE_OTHER): Payer: 59 | Admitting: Dermatology

## 2022-05-11 VITALS — BP 127/73 | HR 70

## 2022-05-11 DIAGNOSIS — W908XXA Exposure to other nonionizing radiation, initial encounter: Secondary | ICD-10-CM

## 2022-05-11 DIAGNOSIS — L578 Other skin changes due to chronic exposure to nonionizing radiation: Secondary | ICD-10-CM

## 2022-05-11 DIAGNOSIS — D1801 Hemangioma of skin and subcutaneous tissue: Secondary | ICD-10-CM

## 2022-05-11 DIAGNOSIS — L910 Hypertrophic scar: Secondary | ICD-10-CM | POA: Diagnosis not present

## 2022-05-11 DIAGNOSIS — L821 Other seborrheic keratosis: Secondary | ICD-10-CM

## 2022-05-11 DIAGNOSIS — L814 Other melanin hyperpigmentation: Secondary | ICD-10-CM

## 2022-05-11 DIAGNOSIS — Z1283 Encounter for screening for malignant neoplasm of skin: Secondary | ICD-10-CM

## 2022-05-11 DIAGNOSIS — L820 Inflamed seborrheic keratosis: Secondary | ICD-10-CM

## 2022-05-11 DIAGNOSIS — D229 Melanocytic nevi, unspecified: Secondary | ICD-10-CM

## 2022-05-11 DIAGNOSIS — X32XXXA Exposure to sunlight, initial encounter: Secondary | ICD-10-CM

## 2022-05-11 DIAGNOSIS — Z85828 Personal history of other malignant neoplasm of skin: Secondary | ICD-10-CM

## 2022-05-11 NOTE — Progress Notes (Signed)
Follow-Up Visit   Subjective  Samuel Padilla is a 52 y.o. male who presents for the following: Skin Cancer Screening and Upper Body Skin Exam  The patient presents for Upper Body Skin Exam (UBSE) for skin cancer screening and mole check. The patient has spots, moles and lesions to be evaluated, some may be new or changing. He has a spot he can feel in his right ear. He also has bumps on the left side of his face that are new.    The following portions of the chart were reviewed this encounter and updated as appropriate: medications, allergies, medical history  Review of Systems:  No other skin or systemic complaints except as noted in HPI or Assessment and Plan.  Objective  Well appearing patient in no apparent distress; mood and affect are within normal limits.  All skin waist up examined. Relevant physical exam findings are noted in the Assessment and Plan.  Right chest Firm pink papule.  Posterior Neck Waxy flesh filiform papule  Right upper outer ear canal 3 mm scaly flesh papule with crusting    Assessment & Plan   Hypertrophic scar Right chest  Biopsy proven. At prior [facility] site for [facility] (9/22)  Discussed IL injection. Patient would like to have done due to itching.   Intralesional steroid injection side effects were reviewed including thinning of the skin and discoloration, such as redness, lightening or darkening.   Intralesional injection - Right chest Location: right chest  Informed Consent: Discussed risks (infection, pain, bleeding, bruising, thinning of the skin, loss of skin pigment, lack of resolution, and recurrence of lesion) and benefits of the procedure, as well as the alternatives. Informed consent was obtained. Preparation: The area was prepared a standard fashion.  Procedure Details: An intralesional injection was performed with Kenalog 40 mg/cc. 0.1 cc in total were injected.  Total number of injections: <7  Plan: The patient was instructed on  post-op care. Recommend OTC analgesia as needed for pain.  Lot [id number] Exp Jan 2025 [facility] [id number]   Inflamed seborrheic keratosis (2) Right upper outer ear canal; Posterior Neck  Symptomatic, irritating, patient would like treated.  Destruction of lesion - Posterior Neck, Right upper outer ear canal  Destruction method: cryotherapy   Informed consent: discussed and consent obtained   Lesion destroyed using liquid nitrogen: Yes   Region frozen until ice ball extended beyond lesion: Yes   Outcome: patient tolerated procedure well with no complications   Post-procedure details: wound care instructions given   Additional details:  Prior to procedure, discussed risks of blister formation, small wound, skin dyspigmentation, or rare scar following cryotherapy. Recommend Vaseline ointment to treated areas while healing.   HISTORY OF BASAL CELL CARCINOMA OF THE SKIN - No evidence of recurrence today - Recommend regular full body skin exams - Recommend daily broad spectrum sunscreen SPF 30+ to sun-exposed areas, reapply every 2 hours as needed.  - Call if any new or changing lesions are noted between office visits  History of Dysplastic Nevi - No evidence of recurrence today - Recommend regular full body skin exams - Recommend daily broad spectrum sunscreen SPF 30+ to sun-exposed areas, reapply every 2 hours as needed.  - Call if any new or changing lesions are noted between office visits   Lentigines, Seborrheic Keratoses, Hemangiomas - Benign normal skin lesions - Sks on R temple/cheek area - Benign-appearing - Call for any changes  Melanocytic Nevi - Tan-brown and/or pink-flesh-colored symmetric macules and papules, including right  upper eyelid - Benign appearing on exam today - Observation - Call clinic for new or changing moles - Recommend daily use of broad spectrum spf 30+ sunscreen to sun-exposed areas.   Actinic Damage - Chronic condition, secondary to cumulative  UV/sun exposure - diffuse scaly erythematous macules with underlying dyspigmentation - Recommend daily broad spectrum sunscreen SPF 30+ to sun-exposed areas, reapply every 2 hours as needed.  - Staying in the shade or wearing long sleeves, sun glasses (UVA+UVB protection) and wide brim hats (4-inch brim around the entire circumference of the hat) are also recommended for sun protection.  - Call for new or changing lesions.  Skin cancer screening performed today.  Return in about 6 months (around 11/11/2022) for Hx BCC, UBSE.  ICherlyn Labella, CMA, am acting as scribe for Willeen Niece, MD .   Documentation: I have reviewed the above documentation for accuracy and completeness, and I agree with the above.  Willeen Niece, MD

## 2022-05-11 NOTE — Patient Instructions (Addendum)
Cryotherapy Aftercare  Wash gently with soap and water everyday.   Apply Vaseline and Band-Aid daily until healed.     Due to recent changes in healthcare laws, you may see results of your pathology and/or laboratory studies on MyChart before the doctors have had a chance to review them. We understand that in some cases there may be results that are confusing or concerning to you. Please understand that not all results are received at the same time and often the doctors may need to interpret multiple results in order to provide you with the best plan of care or course of treatment. Therefore, we ask that you please give us 2 business days to thoroughly review all your results before contacting the office for clarification. Should we see a critical lab result, you will be contacted sooner.   If You Need Anything After Your Visit  If you have any questions or concerns for your doctor, please call our main line at [phone] and press option 4 to reach your doctor's medical assistant. If no one answers, please leave a voicemail as directed and we will return your call as soon as possible. Messages left after 4 pm will be answered the following business day.   You may also send us a message via MyChart. We typically respond to MyChart messages within 1-2 business days.  For prescription refills, please ask your pharmacy to contact our office. Our fax number is [phone].  If you have an urgent issue when the clinic is closed that cannot wait until the next business day, you can page your doctor at the number below.    Please note that while we do our best to be available for urgent issues outside of office hours, we are not available 24/7.   If you have an urgent issue and are unable to reach us, you may choose to seek medical care at your doctor's office, retail clinic, urgent care center, or emergency room.  If you have a medical emergency, please immediately call 911 or go to the  emergency department.  Pager Numbers  - Dr. Kowalski: [phone]  - Dr. Moye: [phone]  - Dr. Stewart: [phone]  In the event of inclement weather, please call our main line at [phone] for an update on the status of any delays or closures.  Dermatology Medication Tips: Please keep the boxes that topical medications come in in order to help keep track of the instructions about where and how to use these. Pharmacies typically print the medication instructions only on the boxes and not directly on the medication tubes.   If your medication is too expensive, please contact our office at [phone] option 4 or send us a message through MyChart.   We are unable to tell what your co-pay for medications will be in advance as this is different depending on your insurance coverage. However, we may be able to find a substitute medication at lower cost or fill out paperwork to get insurance to cover a needed medication.   If a prior authorization is required to get your medication covered by your insurance company, please allow us 1-2 business days to complete this process.  Drug prices often vary depending on where the prescription is filled and some pharmacies may offer cheaper prices.  The website www.goodrx.com contains coupons for medications through different pharmacies. The prices here do not account for what the cost may be with help from insurance (it may be cheaper with your insurance), but the website can   give you the price if you did not use any insurance.  - You can print the associated coupon and take it with your prescription to the pharmacy.  - You may also stop by our office during regular business hours and pick up a GoodRx coupon card.  - If you need your prescription sent electronically to a different pharmacy, notify our office through [facility] MyChart or by phone at [phone] option 4.     Si Usted Necesita Algo Despus de Su Visita  Tambin puede  enviarnos un mensaje a travs de MyChart. Por lo general respondemos a los mensajes de MyChart en el transcurso de 1 a 2 das hbiles.  Para renovar recetas, por favor pida a su farmacia que se ponga en contacto con nuestra oficina. Nuestro nmero de fax es el [phone].  Si tiene un asunto urgente cuando la clnica est cerrada y que no puede esperar hasta el siguiente da hbil, puede llamar/localizar a su doctor(a) al nmero que aparece a continuacin.   Por favor, tenga en cuenta que aunque hacemos todo lo posible para estar disponibles para asuntos urgentes fuera del horario de oficina, no estamos disponibles las 24 horas del da, los 7 das de la semana.   Si tiene un problema urgente y no puede comunicarse con nosotros, puede optar por buscar atencin mdica  en el consultorio de su doctor(a), en una clnica privada, en un centro de atencin urgente o en una sala de emergencias.  Si tiene una emergencia mdica, por favor llame inmediatamente al 911 o vaya a la sala de emergencias.  Nmeros de bper  - Dr. Kowalski: [phone]  - Dra. Moye: [phone]  - Dra. Stewart: [phone]  En caso de inclemencias del tiempo, por favor llame a nuestra lnea principal al [phone] para una actualizacin sobre el estado de cualquier retraso o cierre.  Consejos para la medicacin en dermatologa: Por favor, guarde las cajas en las que vienen los medicamentos de uso tpico para ayudarle a seguir las instrucciones sobre dnde y cmo usarlos. Las farmacias generalmente imprimen las instrucciones del medicamento slo en las cajas y no directamente en los tubos del medicamento.   Si su medicamento es muy caro, por favor, pngase en contacto con nuestra oficina llamando al [phone] y presione la opcin 4 o envenos un mensaje a travs de MyChart.   No podemos decirle cul ser su copago por los medicamentos por adelantado ya que esto es diferente dependiendo de la cobertura de su seguro.  Sin embargo, es posible que podamos encontrar un medicamento sustituto a menor costo o llenar un formulario para que el seguro cubra el medicamento que se considera necesario.   Si se requiere una autorizacin previa para que su compaa de seguros cubra su medicamento, por favor permtanos de 1 a 2 das hbiles para completar este proceso.  Los precios de los medicamentos varan con frecuencia dependiendo del lugar de dnde se surte la receta y alguna farmacias pueden ofrecer precios ms baratos.  El sitio web www.goodrx.com tiene cupones para medicamentos de diferentes farmacias. Los precios aqu no tienen en cuenta lo que podra costar con la ayuda del seguro (puede ser ms barato con su seguro), pero el sitio web puede darle el precio si no utiliz ningn seguro.  - Puede imprimir el cupn correspondiente y llevarlo con su receta a la farmacia.  - Tambin puede pasar por nuestra oficina durante el horario de atencin regular y recoger una tarjeta de cupones de GoodRx.  -   Si necesita que su receta se enve electrnicamente a una farmacia diferente, informe a nuestra oficina a travs de MyChart de [place] o por telfono llamando al [phone] y presione la opcin 4.  

## 2022-05-19 DIAGNOSIS — M25562 Pain in left knee: Secondary | ICD-10-CM | POA: Insufficient documentation

## 2022-05-19 DIAGNOSIS — M13862 Other specified arthritis, left knee: Secondary | ICD-10-CM | POA: Insufficient documentation

## 2022-05-25 ENCOUNTER — Other Ambulatory Visit: Payer: Self-pay | Admitting: Physician Assistant

## 2022-11-09 ENCOUNTER — Other Ambulatory Visit: Payer: Self-pay | Admitting: Physician Assistant

## 2022-11-23 ENCOUNTER — Ambulatory Visit: Payer: 59 | Admitting: Dermatology

## 2022-11-23 DIAGNOSIS — D229 Melanocytic nevi, unspecified: Secondary | ICD-10-CM

## 2022-11-23 DIAGNOSIS — L578 Other skin changes due to chronic exposure to nonionizing radiation: Secondary | ICD-10-CM

## 2022-11-23 DIAGNOSIS — Z1283 Encounter for screening for malignant neoplasm of skin: Secondary | ICD-10-CM | POA: Diagnosis not present

## 2022-11-23 DIAGNOSIS — Z85828 Personal history of other malignant neoplasm of skin: Secondary | ICD-10-CM

## 2022-11-23 DIAGNOSIS — L910 Hypertrophic scar: Secondary | ICD-10-CM | POA: Diagnosis not present

## 2022-11-23 DIAGNOSIS — Z86018 Personal history of other benign neoplasm: Secondary | ICD-10-CM

## 2022-11-23 DIAGNOSIS — L820 Inflamed seborrheic keratosis: Secondary | ICD-10-CM | POA: Diagnosis not present

## 2022-11-23 DIAGNOSIS — L720 Epidermal cyst: Secondary | ICD-10-CM

## 2022-11-23 DIAGNOSIS — W908XXA Exposure to other nonionizing radiation, initial encounter: Secondary | ICD-10-CM

## 2022-11-23 DIAGNOSIS — L821 Other seborrheic keratosis: Secondary | ICD-10-CM

## 2022-11-23 DIAGNOSIS — L729 Follicular cyst of the skin and subcutaneous tissue, unspecified: Secondary | ICD-10-CM

## 2022-11-23 DIAGNOSIS — L814 Other melanin hyperpigmentation: Secondary | ICD-10-CM

## 2022-11-23 DIAGNOSIS — D1801 Hemangioma of skin and subcutaneous tissue: Secondary | ICD-10-CM

## 2022-11-23 NOTE — Progress Notes (Signed)
Follow-Up Visit   Subjective  Samuel Padilla is a 52 y.o. male who presents for the following: Skin Cancer Screening and Upper Body Skin Exam  The patient presents for Upper Body Skin Exam (UBSE) for skin cancer screening and mole check. The patient has spots, moles and lesions to be evaluated, some may be new or changing. He has a few irritating spots on his trunk , scalp, neck and ear. History of multiple BCCs.    Patient accompanied by wife.   The following portions of the chart were reviewed this encounter and updated as appropriate: medications, allergies, medical history  Review of Systems:  No other skin or systemic complaints except as noted in HPI or Assessment and Plan.  Objective  Well appearing patient in no apparent distress; mood and affect are within normal limits.  All skin waist up examined. Relevant physical exam findings are noted in the Assessment and Plan.  inferior chin x 1, L ant neck x 2, L lower axilla x 2, crown x 1, R upper outer ear canal x 1 (2nd tx) x 1, L post shoulder x 1 (9) Erythematous stuck-on, waxy papule   Right Chest Firm pink papule/nodule.    Assessment & Plan   Inflamed seborrheic keratosis (9) inferior chin x 1, L ant neck x 2, L lower axilla x 2, crown x 1, R upper outer ear canal x 1 (2nd tx) x 1, L post shoulder x 1  Symptomatic, irritating, patient would like treated.  Destruction of lesion - inferior chin x 1, L ant neck x 2, L lower axilla x 2, crown x 1, R upper outer ear canal x 1 (2nd tx) x 1, L post shoulder x 1 (9)  Destruction method: cryotherapy   Informed consent: discussed and consent obtained   Lesion destroyed using liquid nitrogen: Yes   Region frozen until ice ball extended beyond lesion: Yes   Outcome: patient tolerated procedure well with no complications   Post-procedure details: wound care instructions given   Additional details:  Prior to procedure, discussed risks of blister formation, small wound, skin  dyspigmentation, or rare scar following cryotherapy. Recommend Vaseline ointment to treated areas while healing.   Hypertrophic scar Right Chest  Biopsy proven. At prior [facility] site for [facility] (9/22).   Discussed 2nd treatment - IL injection. Patient would like to have done due to itching. Some improvement after last injection.  Intralesional steroid injection side effects were reviewed including thinning of the skin and discoloration, such as redness, lightening or darkening.   Intralesional injection - Right Chest Location: right chest  Informed Consent: Discussed risks (infection, pain, bleeding, bruising, thinning of the skin, loss of skin pigment, lack of resolution, and recurrence of lesion) and benefits of the procedure, as well as the alternatives. Informed consent was obtained. Preparation: The area was prepared a standard fashion.  Procedure Details: An intralesional injection was performed with Kenalog 40 mg/cc. 0.15 cc in total were injected.  Total number of injections: <7  Plan: The patient was instructed on post-op care. Recommend OTC analgesia as needed for pain.  Lot [id number] Exp 04/2024 NDC 7846-9629-52    Skin cancer screening performed today.  Actinic Damage - Chronic condition, secondary to cumulative UV/sun exposure - diffuse scaly erythematous macules with underlying dyspigmentation - Recommend daily broad spectrum sunscreen SPF 30+ to sun-exposed areas, reapply every 2 hours as needed.  - Staying in the shade or wearing long sleeves, sun glasses (UVA+UVB protection) and wide brim  hats (4-inch brim around the entire circumference of the hat) are also recommended for sun protection.  - Call for new or changing lesions.  Lentigines, Seborrheic Keratoses, Hemangiomas - Benign normal skin lesions - Benign-appearing - Call for any changes  Melanocytic Nevi - Tan-brown and/or pink-flesh-colored symmetric macules and papules - Benign appearing on exam today -  Observation - Call clinic for new or changing moles - Recommend daily use of broad spectrum spf 30+ sunscreen to sun-exposed areas.   HISTORY OF BASAL CELL CARCINOMA OF THE SKIN Multiple, see history. - No evidence of recurrence today - Recommend regular full body skin exams - Recommend daily broad spectrum sunscreen SPF 30+ to sun-exposed areas, reapply every 2 hours as needed.  - Call if any new or changing lesions are noted between office visits  History of Dysplastic Nevi - No evidence of recurrence today - Recommend regular full body skin exams - Recommend daily broad spectrum sunscreen SPF 30+ to sun-exposed areas, reapply every 2 hours as needed.  - Call if any new or changing lesions are noted between office visits  EPIDERMAL INCLUSION CYST Exam: Subcutaneous nodule at left posterior flank  Benign-appearing. Exam most consistent with an epidermal inclusion cyst. Discussed that a cyst is a benign growth that can grow over time and sometimes get irritated or inflamed. Recommend observation if it is not bothersome. Discussed option of surgical excision to remove it if it is growing, symptomatic, or other changes noted. Please call for new or changing lesions so they can be evaluated.    Return in about 6 months (around 05/23/2023) for UBSE, Hx BCC.  ICherlyn Labella, CMA, am acting as scribe for Willeen Niece, MD .   Documentation: I have reviewed the above documentation for accuracy and completeness, and I agree with the above.  Willeen Niece, MD

## 2022-11-23 NOTE — Patient Instructions (Addendum)

## 2023-01-26 ENCOUNTER — Ambulatory Visit: Payer: 59 | Admitting: Dermatology

## 2023-01-26 ENCOUNTER — Encounter: Payer: Self-pay | Admitting: Dermatology

## 2023-01-26 DIAGNOSIS — D492 Neoplasm of unspecified behavior of bone, soft tissue, and skin: Secondary | ICD-10-CM | POA: Diagnosis not present

## 2023-01-26 DIAGNOSIS — L578 Other skin changes due to chronic exposure to nonionizing radiation: Secondary | ICD-10-CM

## 2023-01-26 DIAGNOSIS — W908XXA Exposure to other nonionizing radiation, initial encounter: Secondary | ICD-10-CM | POA: Diagnosis not present

## 2023-01-26 DIAGNOSIS — L110 Acquired keratosis follicularis: Secondary | ICD-10-CM | POA: Diagnosis not present

## 2023-01-26 DIAGNOSIS — L814 Other melanin hyperpigmentation: Secondary | ICD-10-CM

## 2023-01-26 NOTE — Progress Notes (Signed)
   Follow-Up Visit   Subjective  Samuel Padilla is a 53 y.o. male who presents for the following: ISK inf chin, txed with LN2 11/23/22, did not resolve The patient has spots, moles and lesions to be evaluated, some may be new or changing and the patient may have concern these could be cancer.   The following portions of the chart were reviewed this encounter and updated as appropriate: medications, allergies, medical history  Review of Systems:  No other skin or systemic complaints except as noted in HPI or Assessment and Plan.  Objective  Well appearing patient in no apparent distress; mood and affect are within normal limits.   A focused examination was performed of the following areas: Neck, face  Relevant exam findings are noted in the Assessment and Plan.  R inferior chin 5.14mm verrucous pap   Assessment & Plan     NEOPLASM OF SKIN R inferior chin Epidermal / dermal shaving  Lesion diameter (cm):  0.5 Informed consent: discussed and consent obtained   Patient was prepped and draped in usual sterile fashion: area prepped with alcohol. Anesthesia: the lesion was anesthetized in a standard fashion   Anesthetic:  1% lidocaine w/ epinephrine 1-100,000 buffered w/ 8.4% NaHCO3 Instrument used: DermaBlade   Hemostasis achieved with: pressure, aluminum chloride and electrodesiccation   Outcome: patient tolerated procedure well   Post-procedure details: wound care instructions given   Post-procedure details comment:  Ointment and small bandage applied Specimen 1 - Surgical pathology Differential Diagnosis: ISK vs Wart  Check Margins: yes 5.67mm verrucous pap  LENTIGINES Exam: scattered tan macules face Due to sun exposure Treatment Plan: Benign-appearing, observe. Recommend daily broad spectrum sunscreen SPF 30+ to sun-exposed areas, reapply every 2 hours as needed.  Call for any changes   ACTINIC DAMAGE - chronic, secondary to cumulative UV radiation exposure/sun  exposure over time - diffuse scaly erythematous macules with underlying dyspigmentation - Recommend daily broad spectrum sunscreen SPF 30+ to sun-exposed areas, reapply every 2 hours as needed.  - Recommend staying in the shade or wearing long sleeves, sun glasses (UVA+UVB protection) and wide brim hats (4-inch brim around the entire circumference of the hat). - Call for new or changing lesions.   Return for as scheduled.  I, Ardis Rowan, RMA, am acting as scribe for Willeen Niece, MD .   Documentation: I have reviewed the above documentation for accuracy and completeness, and I agree with the above.  Willeen Niece, MD

## 2023-01-26 NOTE — Patient Instructions (Addendum)

## 2023-01-27 ENCOUNTER — Ambulatory Visit: Payer: Self-pay

## 2023-01-27 DIAGNOSIS — Z0000 Encounter for general adult medical examination without abnormal findings: Secondary | ICD-10-CM

## 2023-01-27 LAB — POCT URINALYSIS DIPSTICK
Bilirubin, UA: NEGATIVE
Blood, UA: POSITIVE
Glucose, UA: NEGATIVE
Ketones, UA: NEGATIVE
Leukocytes, UA: NEGATIVE
Nitrite, UA: NEGATIVE
Protein, UA: POSITIVE — AB
Spec Grav, UA: >=1.03 — AB (ref 1.010–1.025)
Urobilinogen, UA: 0.2 U/dL
pH, UA: 5.5 (ref 5.0–8.0)

## 2023-01-27 LAB — SURGICAL PATHOLOGY

## 2023-01-28 LAB — CMP12+LP+TP+TSH+6AC+PSA+CBC…
ALT: 18 [IU]/L (ref 0–44)
AST: 16 [IU]/L (ref 0–40)
Albumin: 4.3 g/dL (ref 3.8–4.9)
Alkaline Phosphatase: 118 [IU]/L (ref 44–121)
BUN/Creatinine Ratio: 9 (ref 9–20)
BUN: 9 mg/dL (ref 6–24)
Basophils Absolute: 0.1 10*3/uL (ref 0.0–0.2)
Basos: 1 %
Bilirubin Total: 0.6 mg/dL (ref 0.0–1.2)
Calcium: 8.7 mg/dL (ref 8.7–10.2)
Chloride: 105 mmol/L (ref 96–106)
Chol/HDL Ratio: 4.6 {ratio} (ref 0.0–5.0)
Cholesterol, Total: 137 mg/dL (ref 100–199)
Creatinine, Ser: 1 mg/dL (ref 0.76–1.27)
EOS (ABSOLUTE): 0.3 10*3/uL (ref 0.0–0.4)
Eos: 3 %
Estimated CHD Risk: 0.9 times avg. (ref 0.0–1.0)
Free Thyroxine Index: 3.1 (ref 1.2–4.9)
GGT: 29 [IU]/L (ref 0–65)
Globulin, Total: 2 g/dL (ref 1.5–4.5)
Glucose: 108 mg/dL — ABNORMAL HIGH (ref 70–99)
HDL: 30 mg/dL — ABNORMAL LOW (ref >39)
Hematocrit: 51.8 % — ABNORMAL HIGH (ref 37.5–51.0)
Hemoglobin: 17.2 g/dL (ref 13.0–17.7)
Immature Grans (Abs): 0 10*3/uL (ref 0.0–0.1)
Immature Granulocytes: 0 %
Iron: 79 ug/dL (ref 38–169)
LDH: 185 [IU]/L (ref 121–224)
LDL Chol Calc (NIH): 84 mg/dL (ref 0–99)
Lymphocytes Absolute: 1.8 10*3/uL (ref 0.7–3.1)
Lymphs: 20 %
MCH: 30.8 pg (ref 26.6–33.0)
MCHC: 33.2 g/dL (ref 31.5–35.7)
MCV: 93 fL (ref 79–97)
Monocytes Absolute: 0.6 10*3/uL (ref 0.1–0.9)
Monocytes: 7 %
Neutrophils Absolute: 6.5 10*3/uL (ref 1.4–7.0)
Neutrophils: 69 %
Phosphorus: 3.2 mg/dL (ref 2.8–4.1)
Platelets: 237 10*3/uL (ref 150–450)
Potassium: 4.4 mmol/L (ref 3.5–5.2)
Prostate Specific Ag, Serum: 1.4 ng/mL (ref 0.0–4.0)
RBC: 5.58 x10E6/uL (ref 4.14–5.80)
RDW: 12.7 % (ref 11.6–15.4)
Sodium: 143 mmol/L (ref 134–144)
T3 Uptake Ratio: 25 % (ref 24–39)
T4, Total: 12.4 ug/dL — ABNORMAL HIGH (ref 4.5–12.0)
TSH: 2.41 u[IU]/mL (ref 0.450–4.500)
Total Protein: 6.3 g/dL (ref 6.0–8.5)
Triglycerides: 125 mg/dL (ref 0–149)
Uric Acid: 5.7 mg/dL (ref 3.8–8.4)
VLDL Cholesterol Cal: 23 mg/dL (ref 5–40)
WBC: 9.3 10*3/uL (ref 3.4–10.8)
eGFR: 91 mL/min/{1.73_m2} (ref >59)

## 2023-01-31 ENCOUNTER — Telehealth: Payer: Self-pay

## 2023-01-31 NOTE — Telephone Encounter (Signed)
-----   Message from Willeen Niece sent at 01/31/2023 11:25 AM EST ----- 1. Skin, R inferior chin :       INVERTED FOLLICULAR KERATOSIS    Benign growth - please call patient

## 2023-01-31 NOTE — Telephone Encounter (Signed)
Advised pt of by results/sh

## 2023-02-01 ENCOUNTER — Other Ambulatory Visit: Payer: Self-pay | Admitting: Physician Assistant

## 2023-02-01 DIAGNOSIS — E039 Hypothyroidism, unspecified: Secondary | ICD-10-CM

## 2023-02-02 ENCOUNTER — Ambulatory Visit: Payer: Self-pay | Admitting: Student

## 2023-02-02 VITALS — BP 125/89 | HR 95 | Temp 98.0°F | Resp 16 | Ht 72.0 in | Wt 255.0 lb

## 2023-02-02 DIAGNOSIS — Z0000 Encounter for general adult medical examination without abnormal findings: Secondary | ICD-10-CM

## 2023-02-02 NOTE — Progress Notes (Signed)
Samuel Padilla, 53 y.o. male, presents for wellness exam  PCP: System, Provider Not In   Last Labs on 01/27/2023  POCT urinalysis dipstick Order: [id number]  Status: Final result             Component Ref Range & Units (hover) 6 d ago (01/27/23) 11 mo ago (02/25/22) 2 yr ago (01/14/21) 2 yr ago (06/25/20) 3 yr ago (01/28/20) 4 yr ago (12/27/18)  Color, UA Amber yellow dark yellow Yellow R dark yellow Yellow  Clarity, UA Clear clear clear  Clear Clear  Glucose, UA Negative Negative Negative  Negative Negative  Bilirubin, UA Negative neg negative Negative R Negative Negative  Ketones, UA Negative neg negative Negative R Negative Negative  Spec Grav, UA >=1.030 Abnormal  >=1.030 Abnormal  1.025 1.020 R >=1.030 Abnormal  1.015  Blood, UA Positive trace -+ trace CM Trace Abnormal  R Positive CM Negative  Comment: 1+  pH, UA 5.5 6.0 6.0 8.5 High  R 6.0 6.0  Protein, UA Positive Abnormal  Negative Positive Abnormal  CM Negative R Negative Negative  Comment: 1+  Urobilinogen, UA 0.2 0.2 0.2  0.2 0.2  Nitrite, UA Negative neg negative Negative R Negative Negative  Leukocytes, UA Negative Negative Negative Negative Negative Negative  Appearance  medium dark     Odor        Resulting Agency    [facility]       ains abnormal data CMP12+LP+TP+TSH+6AC+PSA+CBC. Order: [id number]  Status: Final result              Component Ref Range & Units (hover) 6 d ago (01/27/23) 11 mo ago (02/28/22) 11 mo ago (02/28/22) 11 mo ago (02/25/22) 2 yr ago (01/14/21) 2 yr ago (06/25/20) 3 yr ago (01/28/20)  Glucose 108 High  116 High  CM  93 97  93 R  Uric Acid 5.7   6.1 CM 6.4 CM  6.6 CM  Comment:            Therapeutic target for gout patients: <6.0  BUN 9 13 R  11 11  12   Creatinine, Ser 1.00 1.07 R  0.97 0.99  1.02  eGFR 91   95 93    BUN/Creatinine Ratio 9   11 11  12   Sodium 143 141 R  144 144  142  Potassium 4.4 3.8 R  4.3 4.4  4.4  Chloride 105 106 R  104 105  104  Calcium 8.7 8.8 Low  R  9.1 9.1   8.8  Phosphorus 3.2   2.7 Low  3.1  2.6 Low   Total Protein 6.3   6.8 6.4  6.3  Albumin 4.3   4.4 4.3 R  4.2 R  Globulin, Total 2.0   2.4 2.1  2.1  Bilirubin Total 0.6   0.5 0.6  0.8  Alkaline Phosphatase 118   114 100  105  LDH 185   167 152  161  AST 16   20 16  15   ALT 18   27 17  17   GGT 29   29 23  29   Iron 79   88 82  75  Cholesterol, Total 137   138 136  134  Triglycerides 125   162 High  138  105  HDL 30 Low    36 Low  35 Low   32 Low   VLDL Cholesterol Cal 23   28 24  20   LDL Chol Calc (NIH) 84   74  77  82  Chol/HDL Ratio 4.6   3.8 CM 3.9 CM  4.2 CM  Comment:                                   T. Chol/HDL Ratio                                             Men  Women                               1/2 Avg.Risk  3.4    3.3                                   Avg.Risk  5.0    4.4                                2X Avg.Risk  9.6    7.1                                3X Avg.Risk 23.4   11.0  Estimated CHD Risk 0.9   0.7 CM 0.7 CM  0.8 CM  Comment: The CHD Risk is based on the T. Chol/HDL ratio. Other factors affect CHD Risk such as hypertension, smoking, diabetes, severe obesity, and family history of premature CHD.  TSH 2.410   1.780 1.860  1.370  T4, Total 12.4 High    9.7 9.5  10.3  T3 Uptake Ratio 25   24 21  Low   25  Free Thyroxine Index 3.1   2.3 2.0  2.6  Prostate Specific Ag, Serum 1.4   1.6 CM 1.1 CM 1.0 CM 0.9 CM  Comment: Roche ECLIA methodology. According to the American Urological Association, Serum PSA should decrease and remain at undetectable levels after radical prostatectomy. The AUA defines biochemical recurrence as an initial PSA value 0.2 ng/mL or greater followed by a subsequent confirmatory PSA value 0.2 ng/mL or greater. Values obtained with different assay methods or kits cannot be used interchangeably. Results cannot be interpreted as absolute evidence of the presence or absence of malignant disease.  WBC 9.3  12.0 High  R 9.2 10.2  8.5  RBC 5.58   5.23 R 5.35 5.52  5.35  Hemoglobin 17.2  16.2 R 16.5 17.2  16.9  Hematocrit 51.8 High   49.4 R 49.4 51.5 High   50.5  MCV 93  94.5 R 92 93  94  MCH 30.8  31.0 R 30.8 31.2  31.6  MCHC 33.2  32.8 R 33.4 33.4  33.5  RDW 12.7  13.0 R 12.5 12.2  12.4  Platelets 237  197 R 231 246  240  Neutrophils 69  75 R 63 67  69  Lymphs 20   24 21  18   Monocytes 7   7 7  8   Eos 3   5 4  3   Basos 1   1 1  1   Neutrophils Absolute 6.5  9.0 High  R 5.8 6.8  5.9  Lymphocytes Absolute 1.8  1.4 R  2.2 2.2  1.6  Monocytes Absolute 0.6   0.7 0.7  0.7  EOS (ABSOLUTE) 0.3   0.4 0.4  0.2  Basophils Absolute 0.1  0.1 R 0.1 0.1  0.0  Immature Granulocytes 0  1 R 0 0  1  Immature Grans (Abs) 0.0   0.0 0.0  0.0  Resulting Agency [facility]          Vaccinations Immunization History  Administered Date(s) Administered   Hepatitis B 02/17/1995, 03/25/1995, 09/12/1995   Influenza Nasal 10/06/2015   Influenza,inj,Quad PF,6+ Mos 10/06/2018, 10/26/2019   Influenza,inj,Quad PF,6-35 Mos 10/29/2021   Influenza-Unspecified 10/18/2017, 10/06/2018, 10/25/2019   Moderna Sars-Covid-2 Vaccination 01/03/2019, 02/02/2019   PPD Test 12/22/2000, 12/12/2002, 03/11/2004, 06/02/2005   Tdap 04/06/2013      Health Maintenance Health Maintenance  Topic Date Due   HIV Screening  Never done   Hepatitis C Screening  Never done   Zoster Vaccines- Shingrix (1 of 2) Never done   COVID-19 Vaccine (3 - Moderna risk series) 03/02/2019   DTaP/Tdap/Td (2 - Td or Tdap) 04/07/2023   Colonoscopy  06/14/2032   INFLUENZA VACCINE  Completed   HPV VACCINES  Aged Out     Social History   Tobacco Use  Smoking Status Never  Smokeless Tobacco Never     Alcohol use:   Social History   Substance and Sexual Activity  Alcohol Use No      Current Vital signs:  Vitals:   02/02/23 1407  Weight: 255 lb (115.7 kg)  Height: 6' (1.829 m)     Previous vital signs:     02/02/2023     2:07 PM  Vitals with BMI  Height 6\' 0"   Weight 255 lbs  BMI 34.58      Past Medical History:  Diagnosis Date   Actinic keratosis    Allergy    Anxiety    Basal cell carcinoma 06/04/2009   Right post. arm   Basal cell carcinoma 11/23/2013   right lat. chest. Superficial BCC, Pinkus type.   Basal cell carcinoma 11/23/2013   left med. post. shoulder. Nodular   Basal cell carcinoma 11/23/2013   right spinal upper back. Superficial   Basal cell carcinoma 11/23/2013   left spinal upper back. Superficial.   Basal cell carcinoma 02/17/2016   spinal mid upper back. Superficial   Basal cell carcinoma 08/24/2016   right upper back. Superficial   Basal cell carcinoma 03/13/2018   medial lower chest. Superficial, nodular   Basal cell carcinoma 03/26/2019   L spinal upper back - ED&C    Basal cell carcinoma 03/26/2019   Post neck - ED&C    Basal cell carcinoma 03/25/2020   R medial clavicle, EDC   BCC (basal cell carcinoma) 09/29/2020   R chest, EDC 10/22/2020   CAD (coronary artery disease)    Colon polyps    Depression    Dysplastic nevus 06/04/2009   Left post. shoulder. Moderate atypia.   Elevated lipids    Family history of colon cancer    GERD (gastroesophageal reflux disease)    Hodgkin's lymphoma (HCC)    Hypothyroidism    Overweight      Current Outpatient Medications on File Prior to Visit  Medication Sig Dispense Refill   aspirin EC 81 MG tablet Take 81 mg by mouth in the morning and at bedtime. Swallow whole.     atorvastatin (LIPITOR) 80 MG tablet atorvastatin 80 mg tablet  FLUoxetine (PROZAC) 20 MG capsule Take 1 capsule (20 mg total) by mouth daily. 90 capsule 3   levothyroxine (SYNTHROID) 100 MCG tablet TAKE 1 TABLET EVERY DAY ON EMPTY STOMACHWITH A GLASS OF WATER AT LEAST 30-60 MINBEFORE BREAKFAST 90 tablet 3   metoprolol succinate (TOPROL-XL) 25 MG 24 hr tablet Take 25 mg by mouth daily.     No current facility-administered medications on file prior  to visit.     ROS   Physical Exam Constitutional:      Appearance: Normal appearance.  HENT:     Head: Normocephalic.     Right Ear: Tympanic membrane, ear canal and external ear normal. There is no impacted cerumen.     Left Ear: Tympanic membrane, ear canal and external ear normal. There is no impacted cerumen.     Mouth/Throat:     Mouth: Mucous membranes are moist.  Eyes:     Extraocular Movements: Extraocular movements intact.     Conjunctiva/sclera: Conjunctivae normal.     Pupils: Pupils are equal, round, and reactive to light.  Cardiovascular:     Rate and Rhythm: Normal rate and regular rhythm.     Heart sounds: Normal heart sounds.  Pulmonary:     Effort: Pulmonary effort is normal.     Breath sounds: Normal breath sounds.  Abdominal:     General: Bowel sounds are normal.     Palpations: Abdomen is soft.     Tenderness: There is no abdominal tenderness.     Hernia: No hernia is present.  Musculoskeletal:        General: Normal range of motion.     Cervical back: Normal range of motion. No tenderness.  Neurological:     Mental Status: He is alert and oriented to person, place, and time.  Psychiatric:        Mood and Affect: Mood normal.        Behavior: Behavior normal.      Assessment/Plan/Recommendation:  Health Maintenance Due  Topic Date Due   HIV Screening  Never done   Hepatitis C Screening  Never done   Zoster Vaccines- Shingrix (1 of 2) Never done   COVID-19 Vaccine (3 - Moderna risk series) 03/02/2019    Presents for annual wellness exam.

## 2023-02-02 NOTE — Progress Notes (Signed)
Here for yearly physical with [employer] and retiring this week.  He denies complaints and follows with other cardiology, dermatology, GI and had an at home sleep study done last week.

## 2023-05-24 ENCOUNTER — Ambulatory Visit: Payer: 59 | Admitting: Dermatology

## 2023-05-24 ENCOUNTER — Encounter: Payer: Self-pay | Admitting: Dermatology

## 2023-05-24 ENCOUNTER — Ambulatory Visit (INDEPENDENT_AMBULATORY_CARE_PROVIDER_SITE_OTHER): Admitting: Dermatology

## 2023-05-24 DIAGNOSIS — Z1283 Encounter for screening for malignant neoplasm of skin: Secondary | ICD-10-CM | POA: Diagnosis not present

## 2023-05-24 DIAGNOSIS — L720 Epidermal cyst: Secondary | ICD-10-CM

## 2023-05-24 DIAGNOSIS — L814 Other melanin hyperpigmentation: Secondary | ICD-10-CM | POA: Diagnosis not present

## 2023-05-24 DIAGNOSIS — L820 Inflamed seborrheic keratosis: Secondary | ICD-10-CM

## 2023-05-24 DIAGNOSIS — Z86018 Personal history of other benign neoplasm: Secondary | ICD-10-CM

## 2023-05-24 DIAGNOSIS — W908XXA Exposure to other nonionizing radiation, initial encounter: Secondary | ICD-10-CM

## 2023-05-24 DIAGNOSIS — L578 Other skin changes due to chronic exposure to nonionizing radiation: Secondary | ICD-10-CM

## 2023-05-24 DIAGNOSIS — L910 Hypertrophic scar: Secondary | ICD-10-CM

## 2023-05-24 DIAGNOSIS — D229 Melanocytic nevi, unspecified: Secondary | ICD-10-CM

## 2023-05-24 DIAGNOSIS — Z85828 Personal history of other malignant neoplasm of skin: Secondary | ICD-10-CM

## 2023-05-24 DIAGNOSIS — L729 Follicular cyst of the skin and subcutaneous tissue, unspecified: Secondary | ICD-10-CM

## 2023-05-24 DIAGNOSIS — D22111 Melanocytic nevi of right upper eyelid, including canthus: Secondary | ICD-10-CM

## 2023-05-24 DIAGNOSIS — L821 Other seborrheic keratosis: Secondary | ICD-10-CM

## 2023-05-24 DIAGNOSIS — D1801 Hemangioma of skin and subcutaneous tissue: Secondary | ICD-10-CM

## 2023-05-24 NOTE — Patient Instructions (Addendum)

## 2023-05-24 NOTE — Progress Notes (Signed)
 Follow-Up Visit   Subjective  Samuel Padilla is a 53 y.o. male who presents for the following: Skin Cancer Screening and Upper Body Skin Exam. Hx of multiple BCCs. Hx of dysplastic nevus. Hx of AKs.   The patient presents for Upper Body Skin Exam (UBSE) for skin cancer screening and mole check. The patient has spots, moles and lesions to be evaluated, some may be new or changing and the patient may have concern these could be cancer. He has irritated spots on his neck.    The following portions of the chart were reviewed this encounter and updated as appropriate: medications, allergies, medical history  Review of Systems:  No other skin or systemic complaints except as noted in HPI or Assessment and Plan.  Objective  Well appearing patient in no apparent distress; mood and affect are within normal limits.  All skin waist up examined. Relevant physical exam findings are noted in the Assessment and Plan.  L Neck x8, R neck x10 (18) Waxy tan pedunculated papules  Assessment & Plan   INFLAMED SEBORRHEIC KERATOSIS (18) L Neck x8, R neck x10 (18) Vs irritated Skin Tags  Symptomatic, irritating, patient would like treated. Destruction of lesion - L Neck x8, R neck x10 (18)  Destruction method: cryotherapy   Informed consent: discussed and consent obtained   Lesion destroyed using liquid nitrogen: Yes   Region frozen until ice ball extended beyond lesion: Yes   Outcome: patient tolerated procedure well with no complications   Post-procedure details: wound care instructions given   Additional details:  Prior to procedure, discussed risks of blister formation, small wound, skin dyspigmentation, or rare scar following cryotherapy. Recommend Vaseline ointment to treated areas while healing.   Skin cancer screening performed today.  HISTORY OF BASAL CELL CARCINOMA OF THE SKIN. Multiple sites, see history.  - No evidence of recurrence today - Recommend regular full body skin exams -  Recommend daily broad spectrum sunscreen SPF 30+ to sun-exposed areas, reapply every 2 hours as needed.  - Call if any new or changing lesions are noted between office visits  HISTORY OF DYSPLASTIC NEVUS. Left posterior shoulder. Moderate atypia. 06/04/2009. No evidence of recurrence today Recommend regular full body skin exams Recommend daily broad spectrum sunscreen SPF 30+ to sun-exposed areas, reapply every 2 hours as needed.  Call if any new or changing lesions are noted between office visits   Actinic Damage. Scalp - Chronic condition, secondary to cumulative UV/sun exposure - diffuse scaly erythematous macules with underlying dyspigmentation - Recommend daily broad spectrum sunscreen SPF 30+ to sun-exposed areas, reapply every 2 hours as needed.  - Staying in the shade or wearing long sleeves, sun glasses (UVA+UVB protection) and wide brim hats (4-inch brim around the entire circumference of the hat) are also recommended for sun protection.  - Call for new or changing lesions.  Lentigines, Seborrheic Keratoses, Hemangiomas - Benign normal skin lesions. Torso, scalp - Benign-appearing - Call for any changes  Melanocytic Nevi - Tan-brown and/or pink-flesh-colored symmetric macules and papules - Flesh papule at right upper eyelid - Benign appearing on exam today - Observation - Call clinic for new or changing moles - Recommend daily use of broad spectrum spf 30+ sunscreen to sun-exposed areas.   EPIDERMAL INCLUSION CYST Exam: Subcutaneous nodule at left posterior flank   Benign-appearing. Exam most consistent with an epidermal inclusion cyst. Discussed that a cyst is a benign growth that can grow over time and sometimes get irritated or inflamed. Recommend  observation if it is not bothersome. Discussed option of surgical excision to remove it if it is growing, symptomatic, or other changes noted. Please call for new or changing lesions so they can be evaluated.   Hypertrophic Scar   Exam: pink papule at right chest with thinning  Biopsy proven. At prior [facility] site for [facility] (9/22).    Treatment: Benign, observe.      Return in about 6 months (around 11/24/2023) for UBSE, HxBCCs, HxDN, HxAKs.  I, Jill Parcell, CMA, am acting as scribe for Artemio Larry, MD.   Documentation: I have reviewed the above documentation for accuracy and completeness, and I agree with the above.  Artemio Larry, MD

## 2023-06-22 ENCOUNTER — Other Ambulatory Visit: Payer: Self-pay

## 2023-06-22 ENCOUNTER — Other Ambulatory Visit: Payer: Self-pay | Admitting: Physician Assistant

## 2023-06-22 DIAGNOSIS — F419 Anxiety disorder, unspecified: Secondary | ICD-10-CM

## 2023-06-22 MED ORDER — FLUOXETINE HCL 20 MG PO CAPS: 20.0000 mg | ORAL_CAPSULE | Freq: Every day | ORAL | 3 refills | Status: AC

## 2023-09-06 DIAGNOSIS — R7303 Prediabetes: Secondary | ICD-10-CM | POA: Insufficient documentation

## 2023-09-06 DIAGNOSIS — E6609 Other obesity due to excess calories: Secondary | ICD-10-CM | POA: Insufficient documentation

## 2023-11-22 ENCOUNTER — Encounter: Payer: Self-pay | Admitting: Emergency Medicine

## 2023-11-22 ENCOUNTER — Ambulatory Visit
Admission: EM | Admit: 2023-11-22 | Discharge: 2023-11-22 | Disposition: A | Discharge status: Home / Self Care | Attending: Emergency Medicine | Admitting: Emergency Medicine

## 2023-11-22 DIAGNOSIS — J019 Acute sinusitis, unspecified: Secondary | ICD-10-CM

## 2023-11-22 MED ORDER — PREDNISONE 10 MG (21) PO TBPK: ORAL_TABLET | Freq: Every day | ORAL | 0 refills | Status: DC

## 2023-11-22 MED ORDER — AMOXICILLIN-POT CLAVULANATE 875-125 MG PO TABS: 1.0000 | ORAL_TABLET | Freq: Two times a day (BID) | ORAL | 0 refills | Status: DC

## 2023-11-22 MED ORDER — PROMETHAZINE-DM 6.25-15 MG/5ML PO SYRP: 5.0000 mL | ORAL_SOLUTION | Freq: Every evening | ORAL | 0 refills | Status: DC | PRN

## 2023-11-22 MED ORDER — BENZONATATE 100 MG PO CAPS: 100.0000 mg | ORAL_CAPSULE | Freq: Three times a day (TID) | ORAL | 0 refills | Status: DC

## 2023-11-22 NOTE — ED Provider Notes (Signed)
 Samuel Padilla    CSN: [id number] Arrival date & time: 11/22/23  1646      History   Chief Complaint Chief Complaint  Patient presents with   Facial Pain   Nasal Congestion   Cough    HPI Samuel Padilla is a 53 y.o. male.   Patient presents for evaluation of a fever peaking at 99.9, nasal congestion, generalized sinus pain and pressure to the face, productive cough with green mucus just a touch of the present for 3 days.  Has seen some blood within the mucus.  Endorses shortness of breath and wheezing at baseline which has not worsened.  Initially had a decreased appetite which has improved.  Has attempted use of DayQuil, Mucinex and NyQuil.  No known sick contacts with similar symptoms as health.  History of interstitial lung disease.    Past Medical History:  Diagnosis Date   Actinic keratosis    Allergy    Anxiety    Basal cell carcinoma 06/04/2009   Right post. arm   Basal cell carcinoma 11/23/2013   right lat. chest. Superficial BCC, Pinkus type.   Basal cell carcinoma 11/23/2013   left med. post. shoulder. Nodular   Basal cell carcinoma 11/23/2013   right spinal upper back. Superficial   Basal cell carcinoma 11/23/2013   left spinal upper back. Superficial.   Basal cell carcinoma 02/17/2016   spinal mid upper back. Superficial   Basal cell carcinoma 08/24/2016   right upper back. Superficial   Basal cell carcinoma 03/13/2018   medial lower chest. Superficial, nodular   Basal cell carcinoma 03/26/2019   L spinal upper back - ED&C    Basal cell carcinoma 03/26/2019   Post neck - ED&C    Basal cell carcinoma 03/25/2020   R medial clavicle, EDC   BCC (basal cell carcinoma) 09/29/2020   R chest, EDC 10/22/2020   CAD (coronary artery disease)    Colon polyps    Depression    Dysplastic nevus 06/04/2009   Left post. shoulder. Moderate atypia.   Elevated lipids    Family history of colon cancer    GERD (gastroesophageal reflux disease)    Hodgkin's  lymphoma (HCC)    Hypothyroidism    Overweight     Patient Active Problem List   Diagnosis Date Noted   Patellofemoral dysfunction of right knee 09/07/2018   Anserine bursitis 09/07/2018   Hypothyroidism 01/09/2018   Excessive daytime sleepiness 12/02/2016   At risk for cardiac dysfunction 07/13/2016   Benign essential HTN 08/07/2015   Mixed hyperlipidemia 08/07/2015   Shortness of breath 08/07/2015    Past Surgical History:  Procedure Laterality Date   COLONOSCOPY  06/05/2014   repeat 2019   ESOPHAGOGASTRODUODENOSCOPY  06/05/2014   TUNNELED VENOUS CATHETER PLACEMENT  1989       Home Medications    Prior to Admission medications   Medication Sig Start Date End Date Taking? Authorizing Provider  aspirin EC 81 MG tablet Take 81 mg by mouth in the morning and at bedtime. Swallow whole.    [provider]  atorvastatin (LIPITOR) 80 MG tablet atorvastatin 80 mg tablet    [provider]  FLUoxetine  (PROZAC ) 20 MG capsule Take 1 capsule (20 mg total) by mouth daily. 06/22/23   Claudene Tanda POUR, PA-C  levothyroxine  (SYNTHROID ) 100 MCG tablet TAKE 1 TABLET EVERY DAY ON EMPTY STOMACHWITH A GLASS OF WATER AT LEAST 30-60 MINBEFORE BREAKFAST 02/01/23   Claudene Tanda POUR, PA-C  omeprazole [facility])  20 MG capsule Take 20 mg by mouth daily.    [provider]  REPATHA SURECLICK 140 MG/ML SOAJ Inject into the skin.    [provider]    Family History Family History  Problem Relation Age of Onset   Diverticulitis Mother    Colon cancer Father    Melanoma Father    COPD Father     Social History Social History   Tobacco Use   Smoking status: Never   Smokeless tobacco: Never  Substance Use Topics   Alcohol use: No   Drug use: Never     Allergies   Cheese, Erythromycin, and Milk (cow)   Review of Systems Review of Systems  Constitutional:  Positive for fever. Negative for activity change, appetite change, chills, diaphoresis, fatigue and  unexpected weight change.  HENT:  Positive for congestion, sinus pressure and sinus pain. Negative for dental problem, drooling, ear discharge, ear pain, facial swelling, hearing loss, mouth sores, nosebleeds, postnasal drip, rhinorrhea, sneezing, sore throat, tinnitus, trouble swallowing and voice change.   Respiratory:  Positive for cough. Negative for apnea, choking, chest tightness, shortness of breath, wheezing and stridor.   Cardiovascular: Negative.   Gastrointestinal: Negative.      Physical Exam Triage Vital Signs ED Triage Vitals  Encounter Vitals Group     BP 11/22/23 1742 123/74     Girls Systolic BP Percentile --      Girls Diastolic BP Percentile --      Boys Systolic BP Percentile --      Boys Diastolic BP Percentile --      Pulse Rate 11/22/23 1742 84     Resp 11/22/23 1742 20     Temp 11/22/23 1742 98.3 F (36.8 C)     Temp Source 11/22/23 1742 Oral     SpO2 11/22/23 1742 96 %     Weight --      Height --      Head Circumference --      Peak Flow --      Pain Score 11/22/23 1741 0     Pain Loc --      Pain Education --      Exclude from Growth Chart --    No data found.  Updated Vital Signs BP 123/74 (BP Location: Left Arm)   Pulse 84   Temp 98.3 F (36.8 C) (Oral)   Resp 20   SpO2 96%   Visual Acuity Right Eye Distance:   Left Eye Distance:   Bilateral Distance:    Right Eye Near:   Left Eye Near:    Bilateral Near:     Physical Exam Constitutional:      Appearance: Normal appearance.  HENT:     Right Ear: A middle ear effusion is present.     Left Ear: A middle ear effusion is present.     Nose: Congestion present. No rhinorrhea.     Right Sinus: No maxillary sinus tenderness or frontal sinus tenderness.     Left Sinus: No maxillary sinus tenderness or frontal sinus tenderness.     Mouth/Throat:     Pharynx: Posterior oropharyngeal erythema present. No oropharyngeal exudate.  Cardiovascular:     Rate and Rhythm: Normal rate and regular  rhythm.     Pulses: Normal pulses.     Heart sounds: Normal heart sounds.  Pulmonary:     Breath sounds: Normal breath sounds.  Lymphadenopathy:     Cervical: No cervical adenopathy.  Neurological:  Mental Status: He is alert and oriented to person, place, and time.      UC Treatments / Results  Labs (all labs ordered are listed, but only abnormal results are displayed) Labs Reviewed - No data to display  EKG   Radiology No results found.  Procedures Procedures (including critical care time)  Medications Ordered in UC Medications - No data to display  Initial Impression / Assessment and Plan / UC Course  I have reviewed the triage vital signs and the nursing notes.  Pertinent labs & imaging results that were available during my care of the patient were reviewed by me and considered in my medical decision making (see chart for details).  Acute nonrecurrent sinusitis  Patient is in no signs of distress nor toxic appearing.  Vital signs are stable.  Low suspicion for pneumonia, pneumothorax or bronchitis and therefore will defer imaging.  Declined viral testing, known sick contact in household, etiology most likely viral empirically placing on Augmentin to prevent worsening of symptoms, additionally prescribed prednisone Tessalon and Promethazine DM for further management. May use additional over-the-counter medications as needed for supportive care.  May follow-up with urgent care as needed if symptoms persist or worsen.   Final Clinical Impressions(s) / UC Diagnoses   Final diagnoses:  None   Discharge Instructions   None    ED Prescriptions   None    PDMP not reviewed this encounter.   Teresa Shelba SAUNDERS, NP 11/22/23 [phone]

## 2023-11-22 NOTE — ED Triage Notes (Signed)
 Patient reports sinus pressure, nasal drainage with green mucus and cough x 3 days. Patient has been taken Dayquil and mucinex with mild relief.

## 2023-11-22 NOTE — Discharge Instructions (Signed)
 Today you are evaluated for your upper respiratory symptoms and manage her symptoms have been persistent for 3 days it is most likely a virus that you have picked up from your significant other, you will be started on antibiotics today to prevent symptoms from worsening or lingering  Take Augmentin twice daily for 7 days for coverage for bacteria  Starting tomorrow take prednisone every morning with food to help reduce sinus pressure, avoid ibuprofen  during treatment but may use Tylenol  You may use Tessalon pill every 8 hours as needed for cough, may use cough syrup at bedtime to allow for rest  You can take Tylenol as needed for fever reduction and pain relief.   For cough: honey 1/2 to 1 teaspoon (you can dilute the honey in water or another fluid).  You can also use guaifenesin and dextromethorphan for cough. You can use a humidifier for chest congestion and cough.  If you don't have a humidifier, you can sit in the bathroom with the hot shower running.      For sore throat: try warm salt water gargles, cepacol lozenges, throat spray, warm tea or water with lemon/honey, popsicles or ice, or OTC cold relief medicine for throat discomfort.   For congestion: take a daily anti-histamine like Zyrtec, Claritin, and a oral decongestant, such as pseudoephedrine.  You can also use Flonase 1-2 sprays in each nostril daily.   It is important to stay hydrated: drink plenty of fluids (water, gatorade/powerade/pedialyte, juices, or teas) to keep your throat moisturized and help further relieve irritation/discomfort.

## 2023-11-28 ENCOUNTER — Ambulatory Visit: Admitting: Dermatology

## 2023-11-28 DIAGNOSIS — C44319 Basal cell carcinoma of skin of other parts of face: Secondary | ICD-10-CM

## 2023-11-28 DIAGNOSIS — Z86018 Personal history of other benign neoplasm: Secondary | ICD-10-CM

## 2023-11-28 DIAGNOSIS — W908XXA Exposure to other nonionizing radiation, initial encounter: Secondary | ICD-10-CM

## 2023-11-28 DIAGNOSIS — C4441 Basal cell carcinoma of skin of scalp and neck: Secondary | ICD-10-CM

## 2023-11-28 DIAGNOSIS — D229 Melanocytic nevi, unspecified: Secondary | ICD-10-CM

## 2023-11-28 DIAGNOSIS — Z1283 Encounter for screening for malignant neoplasm of skin: Secondary | ICD-10-CM | POA: Diagnosis not present

## 2023-11-28 DIAGNOSIS — L918 Other hypertrophic disorders of the skin: Secondary | ICD-10-CM

## 2023-11-28 DIAGNOSIS — L814 Other melanin hyperpigmentation: Secondary | ICD-10-CM

## 2023-11-28 DIAGNOSIS — L578 Other skin changes due to chronic exposure to nonionizing radiation: Secondary | ICD-10-CM

## 2023-11-28 DIAGNOSIS — D489 Neoplasm of uncertain behavior, unspecified: Secondary | ICD-10-CM | POA: Diagnosis not present

## 2023-11-28 DIAGNOSIS — Z85828 Personal history of other malignant neoplasm of skin: Secondary | ICD-10-CM

## 2023-11-28 DIAGNOSIS — L820 Inflamed seborrheic keratosis: Secondary | ICD-10-CM

## 2023-11-28 DIAGNOSIS — L821 Other seborrheic keratosis: Secondary | ICD-10-CM

## 2023-11-28 DIAGNOSIS — L910 Hypertrophic scar: Secondary | ICD-10-CM

## 2023-11-28 DIAGNOSIS — D1801 Hemangioma of skin and subcutaneous tissue: Secondary | ICD-10-CM

## 2023-11-28 NOTE — Progress Notes (Unsigned)
 Follow-Up Visit   Subjective  Samuel Padilla is a 53 y.o. male who presents for the following: Skin Cancer Screening and Upper Body Skin Exam Hx of multiple bccs, Hx of seb derm   Noticed a spot left scalp that bleeds , irritated spots left cheek and at right forearm   The patient presents for Upper Body Skin Exam (UBSE) for skin cancer screening and mole check. The patient has spots, moles and lesions to be evaluated, some may be new or changing and the patient may have concern these could be cancer.    The following portions of the chart were reviewed this encounter and updated as appropriate: medications, allergies, medical history  Review of Systems:  No other skin or systemic complaints except as noted in HPI or Assessment and Plan.  Objective  Well appearing patient in no apparent distress; mood and affect are within normal limits.  All skin waist up examined. Relevant physical exam findings are noted in the Assessment and Plan.  left occipital scalp 6 mm pink pearly papule   right upper temple 7 x 5 mm pearly macule   left forearm x 1, right wrist x 1, left preauricular x 2 (4) Erythematous stuck-on, waxy papule  Assessment & Plan   NEOPLASM OF UNCERTAIN BEHAVIOR (2) left occipital scalp Epidermal / dermal shaving  Lesion diameter (cm):  0.6 Informed consent: discussed and consent obtained   Patient was prepped and draped in usual sterile fashion: Area prepped with alcohol. Anesthesia: the lesion was anesthetized in a standard fashion   Anesthetic:  1% lidocaine w/ epinephrine 1-100,000 buffered w/ 8.4% NaHCO3 Instrument used: flexible razor blade   Hemostasis achieved with: pressure, aluminum chloride and electrodesiccation   Outcome: patient tolerated procedure well    Destruction of lesion  Destruction method: electrodesiccation and curettage   Informed consent: discussed and consent obtained   Curettage performed in three different directions: Yes    Electrodesiccation performed over the curetted area: Yes   Final wound size (cm):  0.7 Hemostasis achieved with:  pressure, aluminum chloride and electrodesiccation Outcome: patient tolerated procedure well with no complications   Post-procedure details: wound care instructions given   Additional details:  Mupirocin  ointment and Bandaid applied   Specimen 1 - Surgical pathology Differential Diagnosis: r/o bcc  ED&C done today Check Margins: No right upper temple Epidermal / dermal shaving  Lesion diameter (cm):  0.7 Informed consent: discussed and consent obtained   Patient was prepped and draped in usual sterile fashion: Area prepped with alcohol. Anesthesia: the lesion was anesthetized in a standard fashion   Anesthetic:  1% lidocaine w/ epinephrine 1-100,000 buffered w/ 8.4% NaHCO3 Instrument used: flexible razor blade   Hemostasis achieved with: pressure, aluminum chloride and electrodesiccation   Outcome: patient tolerated procedure well    Destruction of lesion  Destruction method: electrodesiccation and curettage   Informed consent: discussed and consent obtained   Curettage performed in three different directions: Yes   Electrodesiccation performed over the curetted area: Yes   Final wound size (cm):  1 Hemostasis achieved with:  pressure, aluminum chloride and electrodesiccation Outcome: patient tolerated procedure well with no complications   Post-procedure details: wound care instructions given   Additional details:  Mupirocin  ointment and Bandaid applied   Specimen 2 - Surgical pathology Differential Diagnosis: r/o bcc  ED&C done today   Check Margins: No R/o BCC   INFLAMED SEBORRHEIC KERATOSIS (4) left forearm x 1, right wrist x 1, left preauricular x 2 (  4) Symptomatic, irritating, patient would like treated. Destruction of lesion - left forearm x 1, right wrist x 1, left preauricular x 2 (4)  Destruction method: cryotherapy   Informed consent:  discussed and consent obtained   Timeout:  patient name, date of birth, surgical site, and procedure verified Lesion destroyed using liquid nitrogen: Yes   Region frozen until ice ball extended beyond lesion: Yes   Outcome: patient tolerated procedure well with no complications   Post-procedure details: wound care instructions given   Additional details:  Prior to procedure, discussed risks of blister formation, small wound, skin dyspigmentation, or rare scar following cryotherapy. Recommend Vaseline ointment to treated areas while healing.   Skin cancer screening performed today.  Actinic Damage - Chronic condition, secondary to cumulative UV/sun exposure - diffuse scaly erythematous macules with underlying dyspigmentation - Recommend daily broad spectrum sunscreen SPF 30+ to sun-exposed areas, reapply every 2 hours as needed.  - Staying in the shade or wearing long sleeves, sun glasses (UVA+UVB protection) and wide brim hats (4-inch brim around the entire circumference of the hat) are also recommended for sun protection.  - Call for new or changing lesions.  Lentigines, Seborrheic Keratoses, Hemangiomas - Benign normal skin lesions - Hemangioma - left mid back -clustered purple papules  - Benign-appearing - Call for any changes   Melanocytic Nevi - Tan-brown and/or pink-flesh-colored symmetric macules and papules - Benign appearing on exam today - Observation - Call clinic for new or changing moles - Recommend daily use of broad spectrum spf 30+ sunscreen to sun-exposed areas.    Acrochordons (Skin Tags) - Fleshy, skin-colored pedunculated papules - Benign appearing.  - Observe. - If desired, they can be removed with an in office procedure that is not covered by insurance. - Please call the clinic if you notice any new or changing lesions.  HYPERTROPHIC SCAR Exam: Dyspigmented papule or plaque on chest   Benign-appearing.  Call clinic for new or changing lesions. Discussed  option of intralesional triamcinolone  if this becomes bothersome.   Treatment Plan: Observe.  HISTORY OF BASAL CELL CARCINOMA OF THE SKIN Multiple locations see history  - No evidence of recurrence today - Recommend regular full body skin exams - Recommend daily broad spectrum sunscreen SPF 30+ to sun-exposed areas, reapply every 2 hours as needed.  - Call if any new or changing lesions are noted between office visits  HISTORY OF DYSPLASTIC NEVUS Left posterior shoulder. Moderate atypia. 06/04/2009.  No evidence of recurrence today Recommend regular full body skin exams Recommend daily broad spectrum sunscreen SPF 30+ to sun-exposed areas, reapply every 2 hours as needed.  Call if any new or changing lesions are noted between office visits  Return in about 6 months (around 05/27/2024) for ubse hx of multiple bcc, hx of dysplastic nevi.  I, Eleanor Blush, CMA, am acting as scribe for Rexene Rattler, MD.   Documentation: I have reviewed the above documentation for accuracy and completeness, and I agree with the above.  Rexene Rattler, MD

## 2023-11-28 NOTE — Patient Instructions (Addendum)
 Biopsy Wound Care Instructions  Leave the original bandage on for 24 hours if possible.  If the bandage becomes soaked or soiled before that time, it is OK to remove it and examine the wound.  A small amount of post-operative bleeding is normal.  If excessive bleeding occurs, remove the bandage, place gauze over the site and apply continuous pressure (no peeking) over the area for 30 minutes. If this does not work, please call our clinic as soon as possible or page your doctor if it is after hours.   Once a day, cleanse the wound with soap and water. It is fine to shower. If a thick crust develops you may use a Q-tip dipped into dilute hydrogen peroxide (mix 1:1 with water) to dissolve it.  Hydrogen peroxide can slow the healing process, so use it only as needed.    After washing, apply petroleum jelly (Vaseline) or an antibiotic ointment if your doctor prescribed one for you, followed by a bandage.    For best healing, the wound should be covered with a layer of ointment at all times. If you are not able to keep the area covered with a bandage to hold the ointment in place, this may mean re-applying the ointment several times a day.  Continue this wound care until the wound has healed and is no longer open.   Itching and mild discomfort is normal during the healing process. However, if you develop pain or severe itching, please call our office.   If you have any discomfort, you can take Tylenol (acetaminophen) or ibuprofen  as directed on the bottle. (Please do not take these if you have an allergy to them or cannot take them for another reason).  Some redness, tenderness and white or yellow material in the wound is normal healing.  If the area becomes very sore and red, or develops a thick yellow-green material (pus), it may be infected; please notify us .    If you have stitches, return to clinic as directed to have the stitches removed. You will continue wound care for 2-3 days after the stitches  are removed.   Wound healing continues for up to one year following surgery. It is not unusual to experience pain in the scar from time to time during the interval.  If the pain becomes severe or the scar thickens, you should notify the office.    A slight amount of redness in a scar is expected for the first six months.  After six months, the redness will fade and the scar will soften and fade.  The color difference becomes less noticeable with time.  If there are any problems, return for a post-op surgery check at your earliest convenience.  To improve the appearance of the scar, you can use silicone scar gel, cream, or sheets (such as Mederma or Serica) every night for up to one year. These are available over the counter (without a prescription).  Please call our office at [phone] for any questions or concerns.    Electrodesiccation and Curettage ("Scrape and Burn") Wound Care Instructions  Leave the original bandage on for 24 hours if possible.  If the bandage becomes soaked or soiled before that time, it is OK to remove it and examine the wound.  A small amount of post-operative bleeding is normal.  If excessive bleeding occurs, remove the bandage, place gauze over the site and apply continuous pressure (no peeking) over the area for 30 minutes. If this does not work, please call  our clinic as soon as possible or page your doctor if it is after hours.   Once a day, cleanse the wound with soap and water. It is fine to shower. If a thick crust develops you may use a Q-tip dipped into dilute hydrogen peroxide (mix 1:1 with water) to dissolve it.  Hydrogen peroxide can slow the healing process, so use it only as needed.    After washing, apply petroleum jelly (Vaseline) or an antibiotic ointment if your doctor prescribed one for you, followed by a bandage.    For best healing, the wound should be covered with a layer of ointment at all times. If you are not able to keep the area covered  with a bandage to hold the ointment in place, this may mean re-applying the ointment several times a day.  Continue this wound care until the wound has healed and is no longer open. It may take several weeks for the wound to heal and close.  Itching and mild discomfort is normal during the healing process.  If you have any discomfort, you can take Tylenol (acetaminophen) or ibuprofen  as directed on the bottle. (Please do not take these if you have an allergy to them or cannot take them for another reason).  Some redness, tenderness and white or yellow material in the wound is normal healing.  If the area becomes very sore and red, or develops a thick yellow-green material (pus), it may be infected; please notify us .    Wound healing continues for up to one year following surgery. It is not unusual to experience pain in the scar from time to time during the interval.  If the pain becomes severe or the scar thickens, you should notify the office.    A slight amount of redness in a scar is expected for the first six months.  After six months, the redness will fade and the scar will soften and fade.  The color difference becomes less noticeable with time.  If there are any problems, return for a post-op surgery check at your earliest convenience.  To improve the appearance of the scar, you can use silicone scar gel, cream, or sheets (such as Mederma or Serica) every night for up to one year. These are available over the counter (without a prescription).  Please call our office at [phone] for any questions or concerns.  Seborrheic Keratosis  What causes seborrheic keratoses? Seborrheic keratoses are harmless, common skin growths that first appear during adult life.  As time goes by, more growths appear.  Some people may develop a large number of them.  Seborrheic keratoses appear on both covered and uncovered body parts.  They are not caused by sunlight.  The tendency to develop seborrheic  keratoses can be inherited.  They vary in color from skin-colored to gray, brown, or even black.  They can be either smooth or have a rough, warty surface.   Seborrheic keratoses are superficial and look as if they were stuck on the skin.  Under the microscope this type of keratosis looks like layers upon layers of skin.  That is why at times the top layer may seem to fall off, but the rest of the growth remains and re-grows.    Treatment Seborrheic keratoses do not need to be treated, but can easily be removed in the office.  Seborrheic keratoses often cause symptoms when they rub on clothing or jewelry.  Lesions can be in the way of shaving.  If they become  inflamed, they can cause itching, soreness, or burning.  Removal of a seborrheic keratosis can be accomplished by freezing, burning, or surgery. If any spot bleeds, scabs, or grows rapidly, please return to have it checked, as these can be an indication of a skin cancer.   Cryotherapy Aftercare  Wash gently with soap and water everyday.   Apply Vaseline and Band-Aid daily until healed.   Melanoma ABCDEs  Melanoma is the most dangerous type of skin cancer, and is the leading cause of death from skin disease.  You are more likely to develop melanoma if you: Have light-colored skin, light-colored eyes, or red or blond hair Spend a lot of time in the sun Tan regularly, either outdoors or in a tanning bed Have had blistering sunburns, especially during childhood Have a close family member who has had a melanoma Have atypical moles or large birthmarks  Early detection of melanoma is key since treatment is typically straightforward and cure rates are extremely high if we catch it early.   The first sign of melanoma is often a change in a mole or a new dark spot.  The ABCDE system is a way of remembering the signs of melanoma.  A for asymmetry:  The two halves do not match. B for border:  The edges of the growth are irregular. C for color:   A mixture of colors are present instead of an even brown color. D for diameter:  Melanomas are usually (but not always) greater than 6mm - the size of a pencil eraser. E for evolution:  The spot keeps changing in size, shape, and color.  Please check your skin once per month between visits. You can use a small mirror in front and a large mirror behind you to keep an eye on the back side or your body.   If you see any new or changing lesions before your next follow-up, please call to schedule a visit.  Please continue daily skin protection including broad spectrum sunscreen SPF 30+ to sun-exposed areas, reapplying every 2 hours as needed when you're outdoors.   Staying in the shade or wearing long sleeves, sun glasses (UVA+UVB protection) and wide brim hats (4-inch brim around the entire circumference of the hat) are also recommended for sun protection.    Due to recent changes in healthcare laws, you may see results of your pathology and/or laboratory studies on MyChart before the doctors have had a chance to review them. We understand that in some cases there may be results that are confusing or concerning to you. Please understand that not all results are received at the same time and often the doctors may need to interpret multiple results in order to provide you with the best plan of care or course of treatment. Therefore, we ask that you please give us  2 business days to thoroughly review all your results before contacting the office for clarification. Should we see a critical lab result, you will be contacted sooner.   If You Need Anything After Your Visit  If you have any questions or concerns for your doctor, please call our main line at [phone] and press option 4 to reach your doctor's medical assistant. If no one answers, please leave a voicemail as directed and we will return your call as soon as possible. Messages left after 4 pm will be answered the following business day.   You  may also send us  a message via MyChart. We typically respond to MyChart messages within 1-2  business days.  For prescription refills, please ask your pharmacy to contact our office. Our fax number is [phone].  If you have an urgent issue when the clinic is closed that cannot wait until the next business day, you can page your doctor at the number below.    Please note that while we do our best to be available for urgent issues outside of office hours, we are not available 24/7.   If you have an urgent issue and are unable to reach us , you may choose to seek medical care at your doctor's office, retail clinic, urgent care center, or emergency room.  If you have a medical emergency, please immediately call 911 or go to the emergency department.  Pager Numbers  - Dr. Hester: [phone]  - Dr. Jackquline: [phone]  - Dr. Claudene: [phone]   - Dr. Raymund: [phone]  In the event of inclement weather, please call our main line at [phone] for an update on the status of any delays or closures.  Dermatology Medication Tips: Please keep the boxes that topical medications come in in order to help keep track of the instructions about where and how to use these. Pharmacies typically print the medication instructions only on the boxes and not directly on the medication tubes.   If your medication is too expensive, please contact our office at [phone] option 4 or send us  a message through MyChart.   We are unable to tell what your co-pay for medications will be in advance as this is different depending on your insurance coverage. However, we may be able to find a substitute medication at lower cost or fill out paperwork to get insurance to cover a needed medication.   If a prior authorization is required to get your medication covered by your insurance company, please allow us  1-2 business days to complete this process.  Drug prices often vary depending on where the  prescription is filled and some pharmacies may offer cheaper prices.  The website www.goodrx.com contains coupons for medications through different pharmacies. The prices here do not account for what the cost may be with help from insurance (it may be cheaper with your insurance), but the website can give you the price if you did not use any insurance.  - You can print the associated coupon and take it with your prescription to the pharmacy.  - You may also stop by our office during regular business hours and pick up a GoodRx coupon card.  - If you need your prescription sent electronically to a different pharmacy, notify our office through [facility] or by phone at [phone] option 4.     Si Usted Necesita Algo Despus de Su Visita  Tambin puede enviarnos un mensaje a travs de Clinical Cytogeneticist. Por lo general respondemos a los mensajes de MyChart en el transcurso de 1 a 2 das hbiles.  Para renovar recetas, por favor pida a su farmacia que se ponga en contacto con nuestra oficina. Randi lakes de fax es [place] [phone].  Si tiene un asunto urgente cuando la clnica est cerrada y que no puede esperar hasta el siguiente da hbil, puede llamar/localizar a su doctor(a) al nmero que aparece a continuacin.   Por favor, tenga en cuenta que aunque hacemos todo lo posible para estar disponibles para asuntos urgentes fuera del horario de [place], no estamos disponibles las 24 horas del da, los 7 [address]  [address] de la [place].   Si tiene un problema urgente y no puede comunicarse con nosotros,  puede optar por buscar atencin mdica  en el consultorio de su doctor(a), en una clnica privada, en un centro de atencin urgente o en una sala de emergencias.  Si tiene engineer, drilling, por favor llame inmediatamente al 911 o vaya a la sala de emergencias.  Nmeros de bper  - Dr. Hester: [phone]  - Dra. Jackquline: [phone]  - Dr. Claudene: [phone]  - Dra. Kitts: [phone]  En  caso de inclemencias del [place], por favor llame a nuestra lnea principal al [phone] para una actualizacin sobre el estado de cualquier retraso o cierre.  Consejos para la medicacin en dermatologa: Por favor, guarde las cajas en las que vienen los medicamentos de uso tpico para ayudarle a seguir las instrucciones sobre dnde y cmo usarlos. Las farmacias generalmente imprimen las instrucciones del medicamento slo en las cajas y no directamente en los tubos del [place].   Si su medicamento es muy caro, por favor, pngase en contacto con landry rieger llamando al [phone] y presione la opcin 4 o envenos un mensaje a travs de Clinical Cytogeneticist.   No podemos decirle cul ser su copago por los medicamentos por adelantado ya que esto es diferente dependiendo de la cobertura de su seguro. Sin embargo, es posible que podamos encontrar un medicamento sustituto a audiological scientist un formulario para que el seguro cubra el medicamento que se considera necesario.   Si se requiere una autorizacin previa para que su compaa de seguros cubra su medicamento, por favor permtanos de 1 a 2 das hbiles para completar este proceso.  Los precios de los medicamentos varan con frecuencia dependiendo del environmental consultant de dnde se surte la receta y alguna farmacias pueden ofrecer precios ms baratos.  El sitio web www.goodrx.com tiene cupones para medicamentos de health and safety inspector. Los precios aqu no tienen en cuenta lo que podra costar con la ayuda del seguro (puede ser ms barato con su seguro), pero el sitio web puede darle el precio si no utiliz tourist information centre manager.  - Puede imprimir el cupn correspondiente y llevarlo con su receta a la farmacia.  - Tambin puede pasar por nuestra oficina durante el horario de atencin regular y education officer, museum una tarjeta de cupones de GoodRx.  - Si necesita que su receta se enve electrnicamente a una farmacia diferente, informe a nuestra oficina a travs de MyChart de [facility] o por telfono llamando al [phone] y presione la opcin 4.

## 2023-11-29 ENCOUNTER — Encounter: Payer: Self-pay | Admitting: Dermatology

## 2023-11-29 ENCOUNTER — Ambulatory Visit: Payer: Self-pay | Admitting: Dermatology

## 2023-11-29 LAB — SURGICAL PATHOLOGY

## 2023-11-29 NOTE — Telephone Encounter (Signed)
-----   Message from Rexene Rattler sent at 11/29/2023  3:21 PM EST -----  1. Skin, left occipital scalp :       BASAL CELL CARCINOMA, SUPERFICIAL AND NODULAR PATTERNS  2. Skin, right upper temple :       BASAL CELL CARCINOMA, SUPERFICIAL, NODULAR AND INFILTRATIVE PATTERNS  BCC skin cancers- both already treated with EDC at time of biopsy   - please call patient ----- Message ----- From: Interface, Lab In Three Zero One Sent: 11/29/2023   3:12 PM EST To: Rexene Rattler, MD

## 2023-11-29 NOTE — Telephone Encounter (Signed)
 Advised pt of bx results/sh ?

## 2023-12-26 DIAGNOSIS — R2230 Localized swelling, mass and lump, unspecified upper limb: Secondary | ICD-10-CM | POA: Insufficient documentation

## 2024-01-03 ENCOUNTER — Other Ambulatory Visit: Payer: Self-pay | Admitting: Orthopaedic Surgery

## 2024-01-03 DIAGNOSIS — R2232 Localized swelling, mass and lump, left upper limb: Secondary | ICD-10-CM

## 2024-01-09 ENCOUNTER — Ambulatory Visit: Payer: Self-pay

## 2024-01-09 DIAGNOSIS — Z0000 Encounter for general adult medical examination without abnormal findings: Secondary | ICD-10-CM

## 2024-01-09 LAB — POCT URINALYSIS DIPSTICK
Bilirubin, UA: NEGATIVE
Glucose, UA: NEGATIVE
Ketones, UA: NEGATIVE
Leukocytes, UA: NEGATIVE
Nitrite, UA: NEGATIVE
Protein, UA: POSITIVE — AB
Spec Grav, UA: >=1.03 — AB
Urobilinogen, UA: 0.2 U/dL
pH, UA: 6

## 2024-01-09 NOTE — Progress Notes (Signed)
 Pt completed labs for physical. Gretel Acre

## 2024-01-10 LAB — CMP12+LP+TP+TSH+6AC+PSA+CBC…
ALT: 25 IU/L (ref 0–44)
AST: 18 IU/L (ref 0–40)
Albumin: 4.2 g/dL (ref 3.8–4.9)
Alkaline Phosphatase: 114 IU/L (ref 47–123)
BUN/Creatinine Ratio: 8 — ABNORMAL LOW (ref 9–20)
BUN: 8 mg/dL (ref 6–24)
Basophils Absolute: 0.1 x10E3/uL (ref 0.0–0.2)
Basos: 1 %
Bilirubin Total: 0.7 mg/dL (ref 0.0–1.2)
Calcium: 8.8 mg/dL (ref 8.7–10.2)
Chloride: 102 mmol/L (ref 96–106)
Chol/HDL Ratio: 3.3 ratio (ref 0.0–5.0)
Cholesterol, Total: 100 mg/dL (ref 100–199)
Creatinine, Ser: 0.97 mg/dL (ref 0.76–1.27)
EOS (ABSOLUTE): 0.4 x10E3/uL (ref 0.0–0.4)
Eos: 4 %
Free Thyroxine Index: 2 (ref 1.2–4.9)
GGT: 34 IU/L (ref 0–65)
Globulin, Total: 2.5 g/dL (ref 1.5–4.5)
Glucose: 126 mg/dL — ABNORMAL HIGH (ref 70–99)
HDL: 30 mg/dL — ABNORMAL LOW
Hematocrit: 50.4 % (ref 37.5–51.0)
Hemoglobin: 16.3 g/dL (ref 13.0–17.7)
Immature Grans (Abs): 0.1 x10E3/uL (ref 0.0–0.1)
Immature Granulocytes: 1 %
Iron: 104 ug/dL (ref 38–169)
LDH: 176 IU/L (ref 121–224)
LDL Chol Calc (NIH): 36 mg/dL (ref 0–99)
Lymphocytes Absolute: 2.3 x10E3/uL (ref 0.7–3.1)
Lymphs: 24 %
MCH: 31.1 pg (ref 26.6–33.0)
MCHC: 32.3 g/dL (ref 31.5–35.7)
MCV: 96 fL (ref 79–97)
Monocytes Absolute: 0.7 x10E3/uL (ref 0.1–0.9)
Monocytes: 7 %
Neutrophils Absolute: 6.2 x10E3/uL (ref 1.4–7.0)
Neutrophils: 63 %
Phosphorus: 3.2 mg/dL (ref 2.8–4.1)
Platelets: 251 x10E3/uL (ref 150–450)
Potassium: 4 mmol/L (ref 3.5–5.2)
Prostate Specific Ag, Serum: 1 ng/mL (ref 0.0–4.0)
RBC: 5.24 x10E6/uL (ref 4.14–5.80)
RDW: 13.1 % (ref 11.6–15.4)
Sodium: 140 mmol/L (ref 134–144)
T3 Uptake Ratio: 23 % — ABNORMAL LOW (ref 24–39)
T4, Total: 8.9 ug/dL (ref 4.5–12.0)
TSH: 2.91 u[IU]/mL (ref 0.450–4.500)
Total Protein: 6.7 g/dL (ref 6.0–8.5)
Triglycerides: 214 mg/dL — ABNORMAL HIGH (ref 0–149)
Uric Acid: 5.5 mg/dL (ref 3.8–8.4)
VLDL Cholesterol Cal: 34 mg/dL (ref 5–40)
WBC: 9.7 x10E3/uL (ref 3.4–10.8)
eGFR: 93 mL/min/1.73

## 2024-01-10 LAB — HGB A1C W/O EAG: Hgb A1c MFr Bld: 6.5 % — ABNORMAL HIGH (ref 4.8–5.6)

## 2024-01-16 ENCOUNTER — Encounter: Payer: Self-pay | Admitting: Physician Assistant

## 2024-01-16 ENCOUNTER — Ambulatory Visit: Payer: Self-pay | Admitting: Physician Assistant

## 2024-01-16 VITALS — BP 146/77 | HR 76 | Temp 98.2°F | Resp 12 | Ht 72.0 in | Wt 256.0 lb

## 2024-01-16 DIAGNOSIS — Z0000 Encounter for general adult medical examination without abnormal findings: Secondary | ICD-10-CM

## 2024-01-16 NOTE — Progress Notes (Signed)
 "  [facility]  ____________________________________________   None    (approximate)  I have reviewed the triage vital signs and the nursing notes.   HISTORY  Chief Complaint Annual Exam   HPI Samuel Padilla is a 54 y.o. male patient arrives for annual physical for the [facility] [employer].  Patient was concern for elevated triglycerides and elevated hemoglobin A1c.  Patient states no obvious family history of diabetes.  Patient is taking atorvastatin for lipid control.  This medication is prescribed by his cardiologist.         Past Medical History:  Diagnosis Date   Actinic keratosis    Allergy    Anxiety    Basal cell carcinoma 06/04/2009   Right post. arm   Basal cell carcinoma 11/23/2013   right lat. chest. Superficial BCC, Pinkus type.   Basal cell carcinoma 11/23/2013   left med. post. shoulder. Nodular   Basal cell carcinoma 11/23/2013   right spinal upper back. Superficial   Basal cell carcinoma 11/23/2013   left spinal upper back. Superficial.   Basal cell carcinoma 02/17/2016   spinal mid upper back. Superficial   Basal cell carcinoma 08/24/2016   right upper back. Superficial   Basal cell carcinoma 03/13/2018   medial lower chest. Superficial, nodular   Basal cell carcinoma 03/26/2019   L spinal upper back - ED&C    Basal cell carcinoma 03/26/2019   Post neck - ED&C    Basal cell carcinoma 03/25/2020   R medial clavicle, EDC   Basal cell carcinoma 11/28/2023   left occipital scalp, EDC   Basal cell carcinoma 11/28/2023   Basal cell carcinoma 11/28/2023   right upper temple   BCC (basal cell carcinoma) 09/29/2020   R chest, EDC 10/22/2020   CAD (coronary artery disease)    Colon polyps    Depression    Dysplastic nevus 06/04/2009   Left post. shoulder. Moderate atypia.   Elevated lipids    Family history of colon cancer    GERD (gastroesophageal reflux disease)    Hodgkin's lymphoma (HCC)     Hypothyroidism    Overweight     Patient Active Problem List   Diagnosis Date Noted   Mass of hand 12/26/2023   Obesity due to excess calories 09/06/2023   Prediabetes 09/06/2023   Arthralgia of left knee 05/19/2022   Other specified arthritis, left knee 05/19/2022   Closed fracture of distal phalanx of right thumb 03/18/2022   Acquired trigger finger of right middle finger 07/09/2021   Patellofemoral dysfunction of right knee 09/07/2018   Anserine bursitis 09/07/2018   Hypothyroidism 01/09/2018   Excessive daytime sleepiness 12/02/2016   At risk for cardiac dysfunction 07/13/2016   Benign essential HTN 08/07/2015   Mixed hyperlipidemia 08/07/2015   Shortness of breath 08/07/2015    Past Surgical History:  Procedure Laterality Date   COLONOSCOPY  06/05/2014   repeat 2019   ESOPHAGOGASTRODUODENOSCOPY  06/05/2014   TUNNELED VENOUS CATHETER PLACEMENT  1989    Prior to Admission medications  Medication Sig Start Date End Date Taking? Authorizing Provider  aspirin EC 81 MG tablet Take 81 mg by mouth in the morning and at bedtime. Swallow whole.   Yes [provider]  atorvastatin (LIPITOR) 80 MG tablet atorvastatin 80 mg tablet   Yes [provider]  FLUoxetine  (PROZAC ) 20 MG capsule Take 1 capsule (20 mg total) by mouth daily. 06/22/23  Yes Claudene Tanda POUR, PA-C  levothyroxine  (SYNTHROID )  100 MCG tablet TAKE 1 TABLET EVERY DAY ON EMPTY STOMACHWITH A GLASS OF WATER AT LEAST 30-60 MINBEFORE BREAKFAST 02/01/23  Yes Claudene Tanda POUR, PA-C  metoprolol succinate (TOPROL-XL) 25 MG 24 hr tablet Take 25 mg by mouth at bedtime. 10/03/17  Yes [provider]  omeprazole (PRILOSEC) 20 MG capsule Take 20 mg by mouth daily.   Yes [provider]  REPATHA SURECLICK 140 MG/ML SOAJ Inject into the skin.   Yes [provider]  spironolactone (ALDACTONE) 25 MG tablet Take 25 mg by mouth daily.   Yes [provider]    Allergies Cheese,  Erythromycin, and Milk (cow)  Family History  Problem Relation Age of Onset   Diverticulitis Mother    Colon cancer Father    Melanoma Father    COPD Father     Social History Social History[1]  Review of Systems  Constitutional: No fever/chills Eyes: No visual changes. ENT: No sore throat. Cardiovascular: Denies chest pain. Respiratory: Denies shortness of breath. Gastrointestinal: No abdominal pain.  No nausea, no vomiting.  No diarrhea.  No constipation.  GERD Genitourinary: Negative for dysuria. Musculoskeletal: Negative for back pain. Skin: Negative for rash. Neurological: Negative for headaches, focal weakness or numbness. Psychiatric: Anxiety Endocrine: Hyperlipidemia, hypertension, and hypothyroidism. Allergic/Immunilogical: Cheese, erythromycin, and milk  ____________________________________________   PHYSICAL EXAM:  VITAL SIGNS: BP 146/77  BP Location Left Arm  Patient Position Sitting  Cuff Size Large  Pulse Rate 76  Temp 98.2 F (36.8 C)  Temp Source Temporal  Weight 256 lb (116.1 kg)  Height 6' (1.829 m)  Resp 12  SpO2 98 %   BMI: 34.72 kg/m2  BSA: 2.43 m2   Constitutional: Alert and oriented. Well appearing and in no acute distress. Eyes: Conjunctivae are normal. PERRL. EOMI. Head: Atraumatic. Nose: No congestion/rhinnorhea. Mouth/Throat: Mucous membranes are moist.  Oropharynx non-erythematous. Neck: No stridor.  No cervical spine tenderness to palpation. Hematological/Lymphatic/Immunilogical: No cervical lymphadenopathy. Cardiovascular: Normal rate, regular rhythm. Grossly normal heart sounds.  Good peripheral circulation. Respiratory: Normal respiratory effort.  No retractions. Lungs CTAB. Gastrointestinal: Soft and nontender. No distention. No abdominal bruits. No CVA tenderness. Genitourinary: Deferred Musculoskeletal: No lower extremity tenderness nor edema.  No joint effusions. Neurologic:  Normal speech and language. No gross focal  neurologic deficits are appreciated. No gait instability. Skin:  Skin is warm, dry and intact. No rash noted. Psychiatric: Mood and affect are normal. Speech and behavior are normal.  ____________________________________________   LABS  _  Hgb A1c MFr Bld 6.5 High   Comment:          Prediabetes: 5.7 - 6.4          Diabetes: >6.4          Glycemic control for adults with diabetes: <7.0            Component Ref Range & Units (hover) 7 d ago (01/09/24) 11 mo ago (01/27/23) 1 yr ago (02/25/22) 3 yr ago (01/14/21) 3 yr ago (06/25/20) 3 yr ago (01/28/20) 5 yr ago (12/27/18)  Color, UA dark yellow Amber yellow dark yellow Yellow R dark yellow Yellow  Clarity, UA clear Clear clear clear  Clear Clear  Glucose, UA Negative Negative Negative Negative  Negative Negative  Bilirubin, UA neg Negative neg negative Negative R Negative Negative  Ketones, UA neg Negative neg negative Negative R Negative Negative  Spec Grav, UA >=1.030 Abnormal  >=1.030 Abnormal  >=1.030 Abnormal  1.025 1.020 R >=1.030 Abnormal  1.015  Blood,  UA trace -+ Abnormal  Positive CM trace -+ trace CM Trace Abnormal  R Positive CM Negative  pH, UA 6.0 5.5 6.0 6.0 8.5 High  R 6.0 6.0  Protein, UA Positive Abnormal  Positive Abnormal  CM Negative Positive Abnormal  CM Negative R Negative Negative  Comment: trace -+  Urobilinogen, UA 0.2 0.2 0.2 0.2  0.2 0.2  Nitrite, UA neg Negative neg negative Negative R Negative Negative  Leukocytes, UA Negative Negative Negative Negative Negative Negative Negative  Appearance   medium dark     Odor         Resulting Agency     [facility]                 Component Ref Range & Units (hover) 7 d ago (01/09/24) 11 mo ago (01/27/23) 1 yr ago (02/28/22) 1 yr ago (02/28/22) 1 yr ago (02/25/22) 3 yr ago (01/14/21) 3 yr ago (06/25/20)  Glucose 126 High  108 High  116 High  CM  93 97   Uric Acid 5.5 5.7 CM   6.1 CM 6.4 CM   Comment:            Therapeutic target for gout patients: <6.0  BUN 8 9  13  R  11 11   Creatinine, Ser 0.97 1.00 1.07 R  0.97 0.99   eGFR 93 91   95 93   BUN/Creatinine Ratio 8 Low  9   11 11    Sodium 140 143 141 R  144 144   Potassium 4.0 4.4 3.8 R  4.3 4.4   Chloride 102 105 106 R  104 105   Calcium 8.8 8.7 8.8 Low  R  9.1 9.1   Phosphorus 3.2 3.2   2.7 Low  3.1   Total Protein 6.7 6.3   6.8 6.4   Albumin 4.2 4.3   4.4 4.3 R   Globulin, Total 2.5 2.0   2.4 2.1   Bilirubin Total 0.7 0.6   0.5 0.6   Alkaline Phosphatase 114 118 R   114 R 100 R   LDH 176 185   167 152   AST 18 16   20 16    ALT 25 18   27 17    GGT 34 29   29 23    Iron 104 79   88 82   Cholesterol, Total 100 137   138 136   Triglycerides 214 High  125   162 High  138   HDL 30 Low  30 Low    36 Low  35 Low    VLDL Cholesterol Cal 34 23   28 24    LDL Chol Calc (NIH) 36 84   74 77   Chol/HDL Ratio 3.3 4.6 CM   3.8 CM 3.9 CM   Comment:                                   T. Chol/HDL Ratio                                             Men  Women                               1/2 Avg.Risk  3.4  3.3                                   Avg.Risk  5.0    4.4                                2X Avg.Risk  9.6    7.1                                3X Avg.Risk 23.4   11.0  Estimated CHD Risk  < 0.5 0.9 CM   0.7 CM 0.7 CM   Comment: The CHD Risk is based on the T. Chol/HDL ratio. Other factors affect CHD Risk such as hypertension, smoking, diabetes, severe obesity, and family history of premature CHD.  TSH 2.910 2.410   1.780 1.860   T4, Total 8.9 12.4 High    9.7 9.5   T3 Uptake Ratio 23 Low  25   24 21  Low    Free Thyroxine Index 2.0 3.1   2.3 2.0   Prostate Specific Ag, Serum 1.0 1.4 CM   1.6 CM 1.1 CM 1.0 CM  Comment: Roche ECLIA methodology. According to the American Urological Association, Serum PSA should decrease and remain at undetectable levels after radical prostatectomy. The AUA defines biochemical recurrence as an initial PSA value 0.2 ng/mL or greater followed by a subsequent  confirmatory PSA value 0.2 ng/mL or greater. Values obtained with different assay methods or kits cannot be used interchangeably. Results cannot be interpreted as absolute evidence of the presence or absence of malignant disease.  WBC 9.7 9.3  12.0 High  R 9.2 10.2   RBC 5.24 5.58  5.23 R 5.35 5.52   Hemoglobin 16.3 17.2  16.2 R 16.5 17.2   Hematocrit 50.4 51.8 High   49.4 R 49.4 51.5 High    MCV 96 93  94.5 R 92 93   MCH 31.1 30.8  31.0 R 30.8 31.2   MCHC 32.3 33.2  32.8 R 33.4 33.4   RDW 13.1 12.7  13.0 R 12.5 12.2   Platelets 251 237  197 R 231 246   Neutrophils 63 69  75 R 63 67   Lymphs 24 20   24 21    Monocytes 7 7   7 7    Eos 4 3   5 4    Basos 1 1   1 1    Neutrophils Absolute 6.2 6.5  9.0 High  R 5.8 6.8   Lymphocytes Absolute 2.3 1.8  1.4 R 2.2 2.2   Monocytes Absolute 0.7 0.6   0.7 0.7   EOS (ABSOLUTE) 0.4 0.3   0.4 0.4   Basophils Absolute 0.1 0.1  0.1 R 0.1 0.1   Immature Granulocytes 1 0  1 R 0 0   Immature Grans (Abs) 0.1 0.0   0.0 0.0                        ___________________________________________  EKG  Sinus rhythm at 70 bpm ____________________________________________    ____________________________________________   INITIAL IMPRESSION / ASSESSMENT AND PLAN  As part of my medical decision making, I reviewed the following data within the electronic MEDICAL RECORD NUMBER      No acute findings on physical exam or EKG.  Patient  labs reveal chronic consistent onset of diabetes.  Patient also has elevated triglycerides.  Patient states he will talk to his cardiologist to discuss options for diabetes and lipid control.    ____________________________________________   FINAL CLINICAL IMPRESSION Annual physical exam today shows onset of diabetes and suboptimal triglyceride control.   ED Discharge Orders     None        Note:  This document was prepared using Dragon voice recognition software and may include unintentional dictation errors.      [1]  Social History Tobacco Use   Smoking status: Never   Smokeless tobacco: Never  Substance Use Topics   Alcohol use: No   Drug use: Never   "

## 2024-02-05 ENCOUNTER — Other Ambulatory Visit: Payer: Self-pay | Admitting: Physician Assistant

## 2024-02-05 DIAGNOSIS — E039 Hypothyroidism, unspecified: Secondary | ICD-10-CM

## 2024-06-11 ENCOUNTER — Ambulatory Visit: Admitting: Dermatology
